# Patient Record
Sex: Male | Born: 1950 | Race: White | Hispanic: No | Smoking: Current every day smoker
Health system: Southern US, Community
[De-identification: ages and names within clinical notes are randomized; demographics above are authoritative.]

## PROBLEM LIST (undated history)

## (undated) DIAGNOSIS — K219 Gastro-esophageal reflux disease without esophagitis: Secondary | ICD-10-CM

## (undated) DIAGNOSIS — I639 Cerebral infarction, unspecified: Secondary | ICD-10-CM

## (undated) DIAGNOSIS — E119 Type 2 diabetes mellitus without complications: Secondary | ICD-10-CM

## (undated) DIAGNOSIS — Z8781 Personal history of (healed) traumatic fracture: Secondary | ICD-10-CM

## (undated) DIAGNOSIS — I10 Essential (primary) hypertension: Secondary | ICD-10-CM

## (undated) DIAGNOSIS — E785 Hyperlipidemia, unspecified: Secondary | ICD-10-CM

## (undated) DIAGNOSIS — J449 Chronic obstructive pulmonary disease, unspecified: Secondary | ICD-10-CM

## (undated) HISTORY — DX: Cerebral infarction, unspecified: I63.9

## (undated) HISTORY — PX: MULTIPLE TOOTH EXTRACTIONS: SHX2053

## (undated) HISTORY — DX: Chronic obstructive pulmonary disease, unspecified: J44.9

## (undated) HISTORY — PX: BACK SURGERY: SHX140

---

## 2010-09-24 HISTORY — PX: COLONOSCOPY: SHX174

## 2012-12-10 ENCOUNTER — Encounter: Payer: Self-pay | Admitting: Internal Medicine

## 2012-12-10 DIAGNOSIS — Z800 Family history of malignant neoplasm of digestive organs: Secondary | ICD-10-CM

## 2012-12-10 DIAGNOSIS — M479 Spondylosis, unspecified: Secondary | ICD-10-CM

## 2012-12-10 DIAGNOSIS — G8929 Other chronic pain: Secondary | ICD-10-CM

## 2012-12-22 DIAGNOSIS — M1990 Unspecified osteoarthritis, unspecified site: Secondary | ICD-10-CM

## 2012-12-22 DIAGNOSIS — Z800 Family history of malignant neoplasm of digestive organs: Secondary | ICD-10-CM

## 2012-12-22 DIAGNOSIS — M549 Dorsalgia, unspecified: Secondary | ICD-10-CM

## 2013-01-07 ENCOUNTER — Encounter: Payer: Self-pay | Admitting: Internal Medicine

## 2013-03-17 DIAGNOSIS — R5381 Other malaise: Secondary | ICD-10-CM

## 2013-03-17 DIAGNOSIS — M549 Dorsalgia, unspecified: Secondary | ICD-10-CM

## 2013-03-17 DIAGNOSIS — J4489 Other specified chronic obstructive pulmonary disease: Secondary | ICD-10-CM

## 2013-03-17 DIAGNOSIS — J449 Chronic obstructive pulmonary disease, unspecified: Secondary | ICD-10-CM

## 2013-03-17 DIAGNOSIS — R634 Abnormal weight loss: Secondary | ICD-10-CM

## 2013-03-17 DIAGNOSIS — R5383 Other fatigue: Secondary | ICD-10-CM

## 2015-05-03 ENCOUNTER — Other Ambulatory Visit: Payer: Self-pay | Admitting: Neurosurgery

## 2015-05-03 DIAGNOSIS — M48061 Spinal stenosis, lumbar region without neurogenic claudication: Secondary | ICD-10-CM

## 2015-05-10 ENCOUNTER — Ambulatory Visit
Admission: RE | Admit: 2015-05-10 | Discharge: 2015-05-10 | Disposition: A | Discharge status: Home / Self Care | Payer: Medicare FFS | Source: Ambulatory Visit | Attending: Neurosurgery | Admitting: Neurosurgery

## 2015-05-10 DIAGNOSIS — M48061 Spinal stenosis, lumbar region without neurogenic claudication: Secondary | ICD-10-CM

## 2015-05-10 MED ORDER — IOHEXOL 180 MG/ML  SOLN
15.0000 mL | Freq: Once | INTRAMUSCULAR | Status: DC | PRN
  Administered 2015-05-10: 15 mL via INTRATHECAL

## 2015-05-10 MED ORDER — DIAZEPAM 5 MG PO TABS
10.0000 mg | ORAL_TABLET | Freq: Once | ORAL | Status: AC
  Administered 2015-05-10: 5 mg via ORAL

## 2015-05-10 NOTE — Discharge Instructions (Signed)

## 2015-05-16 ENCOUNTER — Other Ambulatory Visit: Payer: Self-pay | Admitting: Neurosurgery

## 2015-05-23 ENCOUNTER — Encounter (HOSPITAL_COMMUNITY): Payer: Self-pay

## 2015-05-23 ENCOUNTER — Encounter (HOSPITAL_COMMUNITY)
Admission: RE | Admit: 2015-05-23 | Discharge: 2015-05-23 | Disposition: A | Discharge status: Home / Self Care | Payer: Medicare FFS | Source: Ambulatory Visit | Attending: Neurosurgery | Admitting: Neurosurgery

## 2015-05-23 DIAGNOSIS — E785 Hyperlipidemia, unspecified: Secondary | ICD-10-CM | POA: Diagnosis not present

## 2015-05-23 DIAGNOSIS — Z01812 Encounter for preprocedural laboratory examination: Secondary | ICD-10-CM | POA: Diagnosis not present

## 2015-05-23 DIAGNOSIS — Z0183 Encounter for blood typing: Secondary | ICD-10-CM | POA: Diagnosis not present

## 2015-05-23 DIAGNOSIS — E119 Type 2 diabetes mellitus without complications: Secondary | ICD-10-CM | POA: Diagnosis not present

## 2015-05-23 DIAGNOSIS — Z01818 Encounter for other preprocedural examination: Secondary | ICD-10-CM | POA: Insufficient documentation

## 2015-05-23 DIAGNOSIS — Z7982 Long term (current) use of aspirin: Secondary | ICD-10-CM | POA: Insufficient documentation

## 2015-05-23 DIAGNOSIS — Z79899 Other long term (current) drug therapy: Secondary | ICD-10-CM | POA: Diagnosis not present

## 2015-05-23 DIAGNOSIS — F17200 Nicotine dependence, unspecified, uncomplicated: Secondary | ICD-10-CM | POA: Diagnosis not present

## 2015-05-23 DIAGNOSIS — I10 Essential (primary) hypertension: Secondary | ICD-10-CM | POA: Diagnosis not present

## 2015-05-23 HISTORY — DX: Gastro-esophageal reflux disease without esophagitis: K21.9

## 2015-05-23 HISTORY — DX: Type 2 diabetes mellitus without complications: E11.9

## 2015-05-23 HISTORY — DX: Hyperlipidemia, unspecified: E78.5

## 2015-05-23 HISTORY — DX: Essential (primary) hypertension: I10

## 2015-05-23 HISTORY — DX: Personal history of (healed) traumatic fracture: Z87.81

## 2015-05-23 LAB — CBC
HEMATOCRIT: 33.5 % — AB (ref 39.0–52.0)
HEMOGLOBIN: 11.4 g/dL — AB (ref 13.0–17.0)
MCH: 32.1 pg (ref 26.0–34.0)
MCHC: 34 g/dL (ref 30.0–36.0)
MCV: 94.4 fL (ref 78.0–100.0)
Platelets: 271 10*3/uL (ref 150–400)
RBC: 3.55 MIL/uL — AB (ref 4.22–5.81)
RDW: 13.1 % (ref 11.5–15.5)
WBC: 7 10*3/uL (ref 4.0–10.5)

## 2015-05-23 LAB — BASIC METABOLIC PANEL
ANION GAP: 9 (ref 5–15)
BUN: 17 mg/dL (ref 6–20)
CHLORIDE: 107 mmol/L (ref 101–111)
CO2: 20 mmol/L — AB (ref 22–32)
Calcium: 9.7 mg/dL (ref 8.9–10.3)
Creatinine, Ser: 1.81 mg/dL — ABNORMAL HIGH (ref 0.61–1.24)
GFR calc non Af Amer: 38 mL/min — ABNORMAL LOW (ref >60)
GFR, EST AFRICAN AMERICAN: 44 mL/min — AB (ref >60)
Glucose, Bld: 90 mg/dL (ref 65–99)
POTASSIUM: 4.6 mmol/L (ref 3.5–5.1)
SODIUM: 136 mmol/L (ref 135–145)

## 2015-05-23 LAB — ABO/RH: ABO/RH(D): O POS

## 2015-05-23 LAB — TYPE AND SCREEN
ABO/RH(D): O POS
ANTIBODY SCREEN: NEGATIVE

## 2015-05-23 LAB — SURGICAL PCR SCREEN
MRSA, PCR: NEGATIVE
STAPHYLOCOCCUS AUREUS: NEGATIVE

## 2015-05-23 LAB — GLUCOSE, CAPILLARY
Glucose-Capillary: 100 mg/dL — ABNORMAL HIGH (ref 65–99)
Glucose-Capillary: 125 mg/dL — ABNORMAL HIGH (ref 65–99)

## 2015-05-23 NOTE — Pre-Procedure Instructions (Addendum)
Jon Velazquez  05/23/2015      [facility] [address], [place] - [name] [place] [address] Phone: [phone] Fax: [phone]    Your procedure is scheduled on Thursday September 1st 2016 at 730am.  Report to [facility] Admitting at 530 A.M.  Call this number if you have problems the morning of surgery:  [phone]   Call this number if you have problems in the days leading up to your surgery:  [phone]    Remember:  Do not eat food or drink liquids after midnight on Wednesday August 31st.  Take these medicines the morning of surgery with A SIP OF WATER simvastatin (zocor), gemfibrozil (lopid)  DO NOT take your diabetic medications the morning of surgery [glyburide (diabeta), metformin (glucophage), and sitagliptin (januvia)]   STOP: ALL Vitamins, Supplements, Effient and Herbal Medications, Fish Oils, Aspirins, NSAIDs (Nonsteroidal Anti-inflammatories such as Ibuprofen, Aleve, or Advil), and Goody's/BC Powders 7 days prior to surgery, until after surgery as directed by your physician. This includes: Calcium Carbonate, Vitamin D, Omega-3 Fatty Acids (Fish Oil).    Do not wear jewelry.  Do not wear lotions, powders, or perfumes.  You may wear deodorant.  Do not shave 48 hours prior to surgery.  Men may shave face and neck.  Do not bring valuables to the hospital.  [facility] is not responsible for any belongings or valuables.  Contacts, dentures or bridgework may not be worn into surgery.  Leave your suitcase in the car.  After surgery it may be brought to your room.  For patients admitted to the hospital, discharge time will be determined by your treatment team.  Patients discharged the day of surgery will not be allowed to drive home.   How to Manage Your Diabetes Before Surgery   Why is it important to control my blood sugar before and after surgery?   Improving blood sugar levels before and after surgery helps healing  and can limit problems.  A way of improving blood sugar control is eating a healthy diet by:  - Eating less sugar and carbohydrates  - Increasing activity/exercise  - Talk with your doctor about reaching your blood sugar goals  High blood sugars (greater than 180 mg/dL) can raise your risk of infections and slow down your recovery so you will need to focus on controlling your diabetes during the weeks before surgery.  Make sure that the doctor who takes care of your diabetes knows about your planned surgery including the date and location.  How do I manage my blood sugars before surgery?   Check your blood sugar at least 4 times a day, 2 days before surgery to make sure that they are not too high or low.   Check your blood sugar the morning of your surgery when you wake up and every 2               hours until you get to the Short-Stay unit.  If your blood sugar is less than 70 mg/dL, you will need to treat for low blood sugar by:  Treat a low blood sugar (less than 70 mg/dL) with 1/2 cup of clear juice (cranberry or apple), 4 glucose tablets, OR glucose gel.  Recheck blood sugar in 15 minutes after treatment (to make sure it is greater than 70 mg/dL).  If blood sugar is not greater than 70 mg/dL on re-check, call [phone] for further instructions.   Report  your blood sugar to the Short-Stay nurse when you get to Short-Stay.  References:  [facility], 2007 "How to Manage your Diabetes Before and After Surgery".  What do I do about my diabetes medications?   Do not take oral diabetes medicines (pills) the morning of surgery. THIS INCLUDES: GLYBURIDE (DIABETA), METFORMIN (GLUCOPHAGE), SITAGLIPTIN (JANUVIA).    Do not take other diabetes injectables the day of surgery including Byetta, Victoza, Bydureon, and Trulicity.    Special instructions: Please follow these instructions carefully:  1. Shower with CHG Soap the night before surgery  and the morning of Surgery. 2. If you choose to wash your hair, wash your hair first as usual with your normal shampoo. 3. After you shampoo, rinse your hair and body thoroughly to remove the Shampoo. 4. Use CHG as you would any other liquid soap. You can apply chg directly to the skin and wash gently with scrungie or a clean washcloth. 5. Apply the CHG Soap to your body ONLY FROM THE NECK DOWN. Do not use on open wounds or open sores. Avoid contact with your eyes, ears, mouth and genitals (private parts). Wash genitals (private parts) with your normal soap. 6. Wash thoroughly, paying special attention to the area where your surgery will be performed. 7. Thoroughly rinse your body with warm water from the neck down. 8. DO NOT shower/wash with your normal soap after using and rinsing off the CHG Soap. 9. Pat yourself dry with a clean towel.  10. Wear clean pajamas.  11. Place clean sheets on your bed the night of your first shower and do not sleep with pets.  Day of Surgery  Do not apply any lotions/deodorants the morning of surgery. Please wear clean clothes to the hospital/surgery center.    Please read over the following fact sheets that you were given. Pain Booklet, Coughing and Deep Breathing, Blood Transfusion Information, MRSA Information and Surgical Site Infection Prevention

## 2015-05-23 NOTE — Progress Notes (Signed)
Patient denies having seen a cardiologist or having Echo, Stress, EKG, or Cath done.  Patient's PCP is [facility] in [place], [place].

## 2015-05-23 NOTE — Progress Notes (Addendum)
Anesthesia Chart Review:  Pt is 64 year old male scheduled for L1-2, L2-3, L3-4, L4-5 PLIF on 05/26/2015 with Dr. Gerlene Fee.   PCP is Dr. Lia Hopping in [place].   PMH includes: HTN, DM, hyperlipidemia. Current smoker. BMI 27.   Medications include: ASA, benazepril, chlorthalidone, gemfibrozil, glyburide, metformin, simvastatin, sitagliptin.   Preoperative labs reviewed.  Cr 1.81, BUN 17. HgbA1c pending, glucose 125.   EKG 05/23/2015: NSR. Cannot rule out Anterior infarct, age undetermined  Have requested prior labs, office note and EKG from PCP's office for review. Will revisit chart when available.   Rica Mast, FNP-BC [facility]/Anesthesiology Phone: [phone] 05/23/2015 3:55 PM  Addendum: I received a voice message from Amy at [facility]. Apparently after receipt of PAT results, Dr. Olena Leatherwood asked that patient come in today for a pre-operative evaluation. He is recommending patient have a Lexiscan stress test prior to proceeding. There was also mention of possible other testing, but no specifics were given in the voice message. I notified Erie Noe at Dr. Trudee Grip office to contact Amy [phone]) to clarify timing of stress test and clearance status. With surgery scheduled for 05/26/15, I would anticipate a need to postpone.  Velna Ochs [facility] Stay Center/Anesthesiology Phone [phone] 05/24/2015 10:28 AM

## 2015-05-23 NOTE — Progress Notes (Signed)
   05/23/15 1237  OBSTRUCTIVE SLEEP APNEA  Have you ever been diagnosed with sleep apnea through a sleep study? No  Do you snore loudly (loud enough to be heard through closed doors)?  0  Do you often feel tired, fatigued, or sleepy during the daytime? 0  Has anyone observed you stop breathing during your sleep? 0  Do you have, or are you being treated for high blood pressure? 1  BMI more than 35 kg/m2? 0  Age over 65 years old? 1  Neck circumference greater than 40 cm/16 inches? 1 (18)  Gender: 1

## 2015-05-24 LAB — HEMOGLOBIN A1C
Hgb A1c MFr Bld: 7.1 % — ABNORMAL HIGH (ref 4.8–5.6)
Mean Plasma Glucose: 157 mg/dL

## 2015-06-13 ENCOUNTER — Encounter (HOSPITAL_COMMUNITY)
Admission: RE | Admit: 2015-06-13 | Discharge: 2015-06-13 | Disposition: A | Discharge status: Home / Self Care | Payer: Medicare FFS | Source: Ambulatory Visit | Attending: Neurosurgery | Admitting: Neurosurgery

## 2015-06-13 ENCOUNTER — Encounter (HOSPITAL_COMMUNITY): Payer: Self-pay

## 2015-06-13 LAB — BASIC METABOLIC PANEL
ANION GAP: 11 (ref 5–15)
BUN: 19 mg/dL (ref 6–20)
CALCIUM: 9.7 mg/dL (ref 8.9–10.3)
CO2: 19 mmol/L — ABNORMAL LOW (ref 22–32)
Chloride: 105 mmol/L (ref 101–111)
Creatinine, Ser: 1.48 mg/dL — ABNORMAL HIGH (ref 0.61–1.24)
GFR calc Af Amer: 56 mL/min — ABNORMAL LOW (ref >60)
GFR, EST NON AFRICAN AMERICAN: 48 mL/min — AB (ref >60)
GLUCOSE: 274 mg/dL — AB (ref 65–99)
Potassium: 4.8 mmol/L (ref 3.5–5.1)
SODIUM: 135 mmol/L (ref 135–145)

## 2015-06-13 LAB — CBC
HCT: 32.4 % — ABNORMAL LOW (ref 39.0–52.0)
Hemoglobin: 11.1 g/dL — ABNORMAL LOW (ref 13.0–17.0)
MCH: 32.3 pg (ref 26.0–34.0)
MCHC: 34.3 g/dL (ref 30.0–36.0)
MCV: 94.2 fL (ref 78.0–100.0)
Platelets: 278 10*3/uL (ref 150–400)
RBC: 3.44 MIL/uL — ABNORMAL LOW (ref 4.22–5.81)
RDW: 13.4 % (ref 11.5–15.5)
WBC: 9.9 10*3/uL (ref 4.0–10.5)

## 2015-06-13 LAB — GLUCOSE, CAPILLARY: Glucose-Capillary: 278 mg/dL — ABNORMAL HIGH (ref 65–99)

## 2015-06-13 NOTE — Progress Notes (Signed)
Patient presents for second PAT appointment after medical clearance and cardiology clearance completed.  CBC, BMET, and type and screen pending.    Medical and cardiology clearance in chart.    Patient denies shortness of breath and chest pain.   Patient states that he is very impatient and ready to get this surgery over with.

## 2015-06-13 NOTE — Pre-Procedure Instructions (Signed)
Jon Velazquez  06/13/2015      [facility] [address] [place] - [name] [place] [address] Phone: [phone] Fax: [phone]    Your procedure is scheduled on Thursday, September 22nd, 2016  Report to [facility] Admitting at 5:30 A.M.  Call this number if you have problems the morning of surgery:  [phone]   Remember:  Do not eat food or drink liquids after midnight.   Take these medicines the morning of surgery with A SIP OF WATER: Simvastatin (Zocor), Gemfibrozil (Lopid)  Do not take your diabetic medications the morning of surgery: Glyburide (diabeta), Metformin (Glucophage), and Sitagliptin (Januvia)  Stop taking the following: all vitamins, supplements, effient and herbal medications, fish oils, aspirins, NSAIDS, Ibuprofen, Aleve, Naproxen, Goody's, BC's, Fish Oil, Viatmin D, Calcium Carbonate.    Do not wear jewelry.  Do not wear lotions, powders, or colognes.  You may NOT wear deodorant.  Men may shave face and neck.  Do not bring valuables to the hospital.  [facility] is not responsible for any belongings or valuables.  Contacts, dentures or bridgework may not be worn into surgery.  Leave your suitcase in the car.  After surgery it may be brought to your room.  For patients admitted to the hospital, discharge time will be determined by your treatment team.  Patients discharged the day of surgery will not be allowed to drive home.   Special instructions:  See attached.   Please read over the following fact sheets that you were given. Pain Booklet, Coughing and Deep Breathing, Blood Transfusion Information, MRSA Information and Surgical Site Infection Prevention     How to Manage Your Diabetes Before Surgery   Why is it important to control my blood sugar before and after surgery?   Improving blood sugar levels before and after surgery helps healing and can limit problems.  A way of improving blood sugar control  is eating a healthy diet by:  - Eating less sugar and carbohydrates  - Increasing activity/exercise  - Talk with your doctor about reaching your blood sugar goals  High blood sugars (greater than 180 mg/dL) can raise your risk of infections and slow down your recovery so you will need to focus on controlling your diabetes during the weeks before surgery.  Make sure that the doctor who takes care of your diabetes knows about your planned surgery including the date and location.  How do I manage my blood sugars before surgery?   Check your blood sugar at least 4 times a day, 2 days before surgery to make sure that they are not too high or low.   Check your blood sugar the morning of your surgery when you wake up and every 2               hours until you get to the Short-Stay unit.  If your blood sugar is less than 70 mg/dL, you will need to treat for low blood sugar by:  Treat a low blood sugar (less than 70 mg/dL) with 1/2 cup of clear juice (cranberry or apple), 4 glucose tablets, OR glucose gel.  Recheck blood sugar in 15 minutes after treatment (to make sure it is greater than 70 mg/dL).  If blood sugar is not greater than 70 mg/dL on re-check, call [phone] for further instructions.   Report your blood sugar to the Short-Stay nurse when you get to Short-Stay.  References:  [facility], 2007 "How to Manage your Diabetes Before and After Surgery".

## 2015-06-13 NOTE — Progress Notes (Signed)
Anesthesia Chart Review:  Pt is 64 year old male scheduled for L1-2, L2-3, L3-4, L4-5 PLIF on 06/16/2015 with Dr. Gerlene Fee.   PCP is Dr. Lia Hopping in [place].   PMH includes: HTN, DM, hyperlipidemia, GERD. Current smoker. BMI 28.   Medications include: ASA, benazepril, chlorthalidone, gemfibrozil, glyburide, metformin, simvastatin, sitagliptin.   Preoperative labs reviewed. Cr 1.48, BUN 19. HgbA1c was 7.1 on 05/23/15. Glucose 274.   EKG 05/23/2015: NSR. Cannot rule out Anterior infarct, age undetermined  Nuclear stress test 05/31/2015:  -LVEF 45%. Normal wall motion -Negative nuclear stress test  Pt originally was scheduled for surgery 05/26/15, but it was delayed by PCP who wanted pt to have a stress test prior to surgery. Pt now has medical clearance from Dr. Olena Leatherwood on paper chart for surgery.   If no changes, I anticipate pt can proceed with surgery as scheduled.   Rica Mast, FNP-BC [facility] Stay [facility] Center/Anesthesiology Phone: [phone] 06/13/2015 3:11 PM

## 2015-06-16 ENCOUNTER — Inpatient Hospital Stay (HOSPITAL_COMMUNITY): Payer: Medicare FFS

## 2015-06-16 ENCOUNTER — Inpatient Hospital Stay (HOSPITAL_COMMUNITY): Payer: Medicare FFS | Admitting: Emergency Medicine

## 2015-06-16 ENCOUNTER — Encounter (HOSPITAL_COMMUNITY): Payer: Self-pay

## 2015-06-16 ENCOUNTER — Inpatient Hospital Stay (HOSPITAL_COMMUNITY)
Admission: RE | Admit: 2015-06-16 | Discharge: 2015-06-18 | DRG: 460 | Disposition: A | Discharge status: Home / Self Care | Payer: Medicare FFS | Source: Ambulatory Visit | Attending: Neurosurgery | Admitting: Neurosurgery

## 2015-06-16 ENCOUNTER — Inpatient Hospital Stay (HOSPITAL_COMMUNITY): Payer: Medicare FFS | Admitting: Anesthesiology

## 2015-06-16 ENCOUNTER — Encounter (HOSPITAL_COMMUNITY)
Admission: RE | Disposition: A | Discharge status: Home / Self Care | Payer: Medicare FFS | Source: Ambulatory Visit | Attending: Neurosurgery

## 2015-06-16 DIAGNOSIS — K219 Gastro-esophageal reflux disease without esophagitis: Secondary | ICD-10-CM | POA: Diagnosis present

## 2015-06-16 DIAGNOSIS — F17210 Nicotine dependence, cigarettes, uncomplicated: Secondary | ICD-10-CM | POA: Diagnosis present

## 2015-06-16 DIAGNOSIS — M4310 Spondylolisthesis, site unspecified: Secondary | ICD-10-CM | POA: Diagnosis present

## 2015-06-16 DIAGNOSIS — M4806 Spinal stenosis, lumbar region: Principal | ICD-10-CM | POA: Diagnosis present

## 2015-06-16 DIAGNOSIS — I10 Essential (primary) hypertension: Secondary | ICD-10-CM | POA: Diagnosis present

## 2015-06-16 DIAGNOSIS — Z79899 Other long term (current) drug therapy: Secondary | ICD-10-CM | POA: Diagnosis not present

## 2015-06-16 DIAGNOSIS — M549 Dorsalgia, unspecified: Secondary | ICD-10-CM | POA: Diagnosis present

## 2015-06-16 DIAGNOSIS — Z7982 Long term (current) use of aspirin: Secondary | ICD-10-CM | POA: Diagnosis not present

## 2015-06-16 DIAGNOSIS — M48062 Spinal stenosis, lumbar region with neurogenic claudication: Secondary | ICD-10-CM | POA: Diagnosis present

## 2015-06-16 DIAGNOSIS — E119 Type 2 diabetes mellitus without complications: Secondary | ICD-10-CM | POA: Diagnosis present

## 2015-06-16 DIAGNOSIS — M419 Scoliosis, unspecified: Secondary | ICD-10-CM | POA: Diagnosis present

## 2015-06-16 DIAGNOSIS — E785 Hyperlipidemia, unspecified: Secondary | ICD-10-CM | POA: Diagnosis present

## 2015-06-16 DIAGNOSIS — Z419 Encounter for procedure for purposes other than remedying health state, unspecified: Secondary | ICD-10-CM

## 2015-06-16 HISTORY — PX: POSTERIOR LUMBAR FUSION 4 LEVEL: SHX6037

## 2015-06-16 LAB — POCT I-STAT 7, (LYTES, BLD GAS, ICA,H+H)
ACID-BASE DEFICIT: 7 mmol/L — AB (ref 0.0–2.0)
ACID-BASE DEFICIT: 2 mmol/L (ref 0.0–2.0)
BICARBONATE: 23.1 meq/L (ref 20.0–24.0)
Bicarbonate: 19.7 mEq/L — ABNORMAL LOW (ref 20.0–24.0)
CALCIUM ION: 1.06 mmol/L — AB (ref 1.13–1.30)
Calcium, Ion: 1.15 mmol/L (ref 1.13–1.30)
HCT: 32 % — ABNORMAL LOW (ref 39.0–52.0)
HEMATOCRIT: 28 % — AB (ref 39.0–52.0)
HEMOGLOBIN: 10.9 g/dL — AB (ref 13.0–17.0)
Hemoglobin: 9.5 g/dL — ABNORMAL LOW (ref 13.0–17.0)
O2 Saturation: 100 %
O2 Saturation: 100 %
PCO2 ART: 44.3 mmHg (ref 35.0–45.0)
PH ART: 7.257 — AB (ref 7.350–7.450)
PH ART: 7.386 (ref 7.350–7.450)
PO2 ART: 282 mmHg — AB (ref 80.0–100.0)
POTASSIUM: 5.9 mmol/L — AB (ref 3.5–5.1)
Potassium: 5.9 mmol/L — ABNORMAL HIGH (ref 3.5–5.1)
SODIUM: 135 mmol/L (ref 135–145)
Sodium: 132 mmol/L — ABNORMAL LOW (ref 135–145)
TCO2: 21 mmol/L (ref 0–100)
TCO2: 24 mmol/L (ref 0–100)
pCO2 arterial: 38.5 mmHg (ref 35.0–45.0)
pO2, Arterial: 188 mmHg — ABNORMAL HIGH (ref 80.0–100.0)

## 2015-06-16 LAB — PREPARE RBC (CROSSMATCH)

## 2015-06-16 LAB — GLUCOSE, CAPILLARY
GLUCOSE-CAPILLARY: 156 mg/dL — AB (ref 65–99)
GLUCOSE-CAPILLARY: 247 mg/dL — AB (ref 65–99)
Glucose-Capillary: 189 mg/dL — ABNORMAL HIGH (ref 65–99)

## 2015-06-16 LAB — POCT I-STAT 4, (NA,K, GLUC, HGB,HCT)
GLUCOSE: 166 mg/dL — AB (ref 65–99)
GLUCOSE: 182 mg/dL — AB (ref 65–99)
Glucose, Bld: 218 mg/dL — ABNORMAL HIGH (ref 65–99)
HCT: 25 % — ABNORMAL LOW (ref 39.0–52.0)
HEMATOCRIT: 28 % — AB (ref 39.0–52.0)
HEMATOCRIT: 31 % — AB (ref 39.0–52.0)
HEMOGLOBIN: 9.5 g/dL — AB (ref 13.0–17.0)
HEMOGLOBIN: 10.5 g/dL — AB (ref 13.0–17.0)
Hemoglobin: 8.5 g/dL — ABNORMAL LOW (ref 13.0–17.0)
POTASSIUM: 5.5 mmol/L — AB (ref 3.5–5.1)
Potassium: 5.3 mmol/L — ABNORMAL HIGH (ref 3.5–5.1)
Potassium: 5.5 mmol/L — ABNORMAL HIGH (ref 3.5–5.1)
SODIUM: 133 mmol/L — AB (ref 135–145)
Sodium: 133 mmol/L — ABNORMAL LOW (ref 135–145)
Sodium: 136 mmol/L (ref 135–145)

## 2015-06-16 LAB — POCT I-STAT GLUCOSE
GLUCOSE: 220 mg/dL — AB (ref 65–99)
Operator id: 173792

## 2015-06-16 SURGERY — POSTERIOR LUMBAR FUSION 4 LEVEL
Anesthesia: General | Site: Back

## 2015-06-16 MED ORDER — NEOSTIGMINE METHYLSULFATE 10 MG/10ML IV SOLN
INTRAVENOUS | Status: DC | PRN
  Administered 2015-06-16: 5 mg via INTRAVENOUS

## 2015-06-16 MED ORDER — HYDROMORPHONE HCL 1 MG/ML IJ SOLN: 0.2500 mg | INTRAMUSCULAR | Status: DC | PRN

## 2015-06-16 MED ORDER — ZOLPIDEM TARTRATE 5 MG PO TABS: 5.0000 mg | ORAL_TABLET | Freq: Every evening | ORAL | Status: DC | PRN

## 2015-06-16 MED ORDER — BISACODYL 5 MG PO TBEC: 5.0000 mg | DELAYED_RELEASE_TABLET | Freq: Every day | ORAL | Status: DC | PRN

## 2015-06-16 MED ORDER — SODIUM BICARBONATE 4.2 % IV SOLN
INTRAVENOUS | Status: DC | PRN
  Administered 2015-06-16: 100 meq via INTRAVENOUS

## 2015-06-16 MED ORDER — SUFENTANIL CITRATE 50 MCG/ML IV SOLN
INTRAVENOUS | Status: AC
  Filled 2015-06-16: qty 1

## 2015-06-16 MED ORDER — FENTANYL CITRATE (PF) 250 MCG/5ML IJ SOLN
INTRAMUSCULAR | Status: AC
  Filled 2015-06-16: qty 5

## 2015-06-16 MED ORDER — DOCUSATE SODIUM 100 MG PO CAPS
100.0000 mg | ORAL_CAPSULE | Freq: Two times a day (BID) | ORAL | Status: DC
  Administered 2015-06-16 – 2015-06-18 (×3): 100 mg via ORAL
  Filled 2015-06-16 (×4): qty 1

## 2015-06-16 MED ORDER — ACETAMINOPHEN 325 MG PO TABS: 650.0000 mg | ORAL_TABLET | ORAL | Status: DC | PRN

## 2015-06-16 MED ORDER — HYDROMORPHONE HCL 1 MG/ML IJ SOLN
1.0000 mg | Freq: Once | INTRAMUSCULAR | Status: AC
  Administered 2015-06-16: 1 mg via INTRAVENOUS
  Filled 2015-06-16: qty 1

## 2015-06-16 MED ORDER — HYDROMORPHONE HCL 1 MG/ML IJ SOLN
1.0000 mg | INTRAMUSCULAR | Status: DC | PRN
Start: 2015-06-16 — End: 2015-06-18
  Administered 2015-06-17 (×2): 1 mg via INTRAMUSCULAR
  Filled 2015-06-16 (×3): qty 1

## 2015-06-16 MED ORDER — SODIUM CHLORIDE 0.9 % IJ SOLN
3.0000 mL | Freq: Two times a day (BID) | INTRAMUSCULAR | Status: DC
  Administered 2015-06-16: 3 mL via INTRAVENOUS

## 2015-06-16 MED ORDER — THROMBIN 20000 UNITS EX SOLR
CUTANEOUS | Status: DC | PRN
  Administered 2015-06-16 (×4): via TOPICAL

## 2015-06-16 MED ORDER — SUFENTANIL CITRATE 50 MCG/ML IV SOLN
INTRAVENOUS | Status: DC | PRN
  Administered 2015-06-16 (×4): 5 ug via INTRAVENOUS
  Administered 2015-06-16: 10 ug via INTRAVENOUS
  Administered 2015-06-16: 20 ug via INTRAVENOUS
  Administered 2015-06-16 (×5): 5 ug via INTRAVENOUS
  Administered 2015-06-16 (×2): 10 ug via INTRAVENOUS
  Administered 2015-06-16: 5 ug via INTRAVENOUS

## 2015-06-16 MED ORDER — SODIUM CHLORIDE 0.9 % IJ SOLN: 3.0000 mL | INTRAMUSCULAR | Status: DC | PRN

## 2015-06-16 MED ORDER — CEFAZOLIN SODIUM-DEXTROSE 2-3 GM-% IV SOLR
2.0000 g | Freq: Three times a day (TID) | INTRAVENOUS | Status: AC
  Administered 2015-06-16 – 2015-06-17 (×2): 2 g via INTRAVENOUS
  Filled 2015-06-16 (×2): qty 50

## 2015-06-16 MED ORDER — METFORMIN HCL 500 MG PO TABS
1000.0000 mg | ORAL_TABLET | Freq: Two times a day (BID) | ORAL | Status: DC
  Administered 2015-06-17: 1000 mg via ORAL
  Filled 2015-06-16: qty 2

## 2015-06-16 MED ORDER — ONDANSETRON HCL 4 MG/2ML IJ SOLN
4.0000 mg | INTRAMUSCULAR | Status: DC | PRN
  Administered 2015-06-16 – 2015-06-17 (×2): 4 mg via INTRAVENOUS
  Filled 2015-06-16 (×2): qty 2

## 2015-06-16 MED ORDER — GLYCOPYRROLATE 0.2 MG/ML IJ SOLN
INTRAMUSCULAR | Status: DC | PRN
  Administered 2015-06-16: 0.6 mg via INTRAVENOUS

## 2015-06-16 MED ORDER — PROPOFOL 10 MG/ML IV BOLUS
INTRAVENOUS | Status: DC | PRN
  Administered 2015-06-16: 150 mg via INTRAVENOUS

## 2015-06-16 MED ORDER — INSULIN ASPART 100 UNIT/ML ~~LOC~~ SOLN: 0.0000 [IU] | Freq: Three times a day (TID) | SUBCUTANEOUS | Status: DC

## 2015-06-16 MED ORDER — POTASSIUM CHLORIDE IN NACL 20-0.45 MEQ/L-% IV SOLN
INTRAVENOUS | Status: DC
  Administered 2015-06-16 – 2015-06-17 (×2): via INTRAVENOUS
  Filled 2015-06-16 (×4): qty 1000

## 2015-06-16 MED ORDER — MAGNESIUM CITRATE PO SOLN
1.0000 | Freq: Once | ORAL | Status: DC | PRN
  Filled 2015-06-16: qty 296

## 2015-06-16 MED ORDER — DEXTROSE 5 % IV SOLN
10.0000 mg | INTRAVENOUS | Status: DC | PRN
  Administered 2015-06-16: 30 ug/min via INTRAVENOUS

## 2015-06-16 MED ORDER — LIDOCAINE HCL (CARDIAC) 20 MG/ML IV SOLN
INTRAVENOUS | Status: DC | PRN
  Administered 2015-06-16: 60 mg via INTRAVENOUS

## 2015-06-16 MED ORDER — BUPIVACAINE HCL (PF) 0.5 % IJ SOLN
INTRAMUSCULAR | Status: DC | PRN
  Administered 2015-06-16: 30 mL

## 2015-06-16 MED ORDER — SODIUM CHLORIDE 0.9 % IR SOLN
Status: DC | PRN
  Administered 2015-06-16 (×2)

## 2015-06-16 MED ORDER — PANTOPRAZOLE SODIUM 40 MG IV SOLR
40.0000 mg | Freq: Every day | INTRAVENOUS | Status: DC
  Administered 2015-06-16 – 2015-06-17 (×2): 40 mg via INTRAVENOUS
  Filled 2015-06-16 (×2): qty 40

## 2015-06-16 MED ORDER — SODIUM CHLORIDE 0.9 % IV SOLN
INTRAVENOUS | Status: DC | PRN
  Administered 2015-06-16: 12:00:00 via INTRAVENOUS

## 2015-06-16 MED ORDER — DEXAMETHASONE SODIUM PHOSPHATE 4 MG/ML IJ SOLN: 4.0000 mg | Freq: Four times a day (QID) | INTRAMUSCULAR | Status: AC

## 2015-06-16 MED ORDER — LACTATED RINGERS IV SOLN
INTRAVENOUS | Status: DC | PRN
  Administered 2015-06-16 (×2): via INTRAVENOUS

## 2015-06-16 MED ORDER — MIDAZOLAM HCL 2 MG/2ML IJ SOLN
INTRAMUSCULAR | Status: AC
  Filled 2015-06-16: qty 2

## 2015-06-16 MED ORDER — INSULIN ASPART 100 UNIT/ML ~~LOC~~ SOLN
4.0000 [IU] | Freq: Three times a day (TID) | SUBCUTANEOUS | Status: DC
Start: 2015-06-17 — End: 2015-06-18

## 2015-06-16 MED ORDER — ONDANSETRON HCL 4 MG/2ML IJ SOLN
INTRAMUSCULAR | Status: DC | PRN
  Administered 2015-06-16: 4 mg via INTRAVENOUS

## 2015-06-16 MED ORDER — DEXAMETHASONE 4 MG PO TABS
4.0000 mg | ORAL_TABLET | Freq: Four times a day (QID) | ORAL | Status: AC
  Administered 2015-06-16: 4 mg via ORAL
  Filled 2015-06-16 (×2): qty 1

## 2015-06-16 MED ORDER — LACTATED RINGERS IV SOLN
INTRAVENOUS | Status: DC | PRN
  Administered 2015-06-16: 08:00:00 via INTRAVENOUS

## 2015-06-16 MED ORDER — ALUM & MAG HYDROXIDE-SIMETH 200-200-20 MG/5ML PO SUSP
30.0000 mL | Freq: Four times a day (QID) | ORAL | Status: DC | PRN
Start: 2015-06-16 — End: 2015-06-18
  Administered 2015-06-17: 30 mL via ORAL
  Filled 2015-06-16: qty 30

## 2015-06-16 MED ORDER — ALBUMIN HUMAN 5 % IV SOLN
INTRAVENOUS | Status: DC | PRN
  Administered 2015-06-16 (×2): via INTRAVENOUS

## 2015-06-16 MED ORDER — BENAZEPRIL HCL 20 MG PO TABS
40.0000 mg | ORAL_TABLET | Freq: Every day | ORAL | Status: DC
  Administered 2015-06-18: 40 mg via ORAL
  Filled 2015-06-16 (×2): qty 2

## 2015-06-16 MED ORDER — FUROSEMIDE 10 MG/ML IJ SOLN
INTRAMUSCULAR | Status: DC | PRN
  Administered 2015-06-16 (×2): 10 mg via INTRAMUSCULAR

## 2015-06-16 MED ORDER — DEXAMETHASONE SODIUM PHOSPHATE 10 MG/ML IJ SOLN
10.0000 mg | INTRAMUSCULAR | Status: AC
  Administered 2015-06-16: 10 mg via INTRAVENOUS
  Filled 2015-06-16: qty 1

## 2015-06-16 MED ORDER — LINAGLIPTIN 5 MG PO TABS
5.0000 mg | ORAL_TABLET | Freq: Every day | ORAL | Status: DC
  Administered 2015-06-17 – 2015-06-18 (×2): 5 mg via ORAL
  Filled 2015-06-16 (×3): qty 1

## 2015-06-16 MED ORDER — PHENOL 1.4 % MT LIQD: 1.0000 | OROMUCOSAL | Status: DC | PRN

## 2015-06-16 MED ORDER — ROCURONIUM BROMIDE 100 MG/10ML IV SOLN
INTRAVENOUS | Status: DC | PRN
  Administered 2015-06-16 (×6): 10 mg via INTRAVENOUS
  Administered 2015-06-16: 60 mg via INTRAVENOUS
  Administered 2015-06-16 (×3): 20 mg via INTRAVENOUS
  Administered 2015-06-16 (×3): 10 mg via INTRAVENOUS

## 2015-06-16 MED ORDER — SODIUM CHLORIDE 0.9 % IV SOLN
10.0000 mL/h | Freq: Once | INTRAVENOUS | Status: AC
  Administered 2015-06-16: 12:00:00 via INTRAVENOUS

## 2015-06-16 MED ORDER — FENTANYL 50 MCG/ML (10ML) SYRINGE FOR MEDFUSION PUMP - OPTIME
INTRAMUSCULAR | Status: DC | PRN
  Administered 2015-06-16 (×2): 50 ug via INTRAVENOUS

## 2015-06-16 MED ORDER — ARTIFICIAL TEARS OP OINT
TOPICAL_OINTMENT | OPHTHALMIC | Status: DC | PRN
  Administered 2015-06-16: 1 via OPHTHALMIC

## 2015-06-16 MED ORDER — GLYBURIDE 5 MG PO TABS
5.0000 mg | ORAL_TABLET | Freq: Two times a day (BID) | ORAL | Status: DC
  Administered 2015-06-16 – 2015-06-17 (×3): 5 mg via ORAL
  Filled 2015-06-16 (×5): qty 1

## 2015-06-16 MED ORDER — VANCOMYCIN HCL 1000 MG IV SOLR
INTRAVENOUS | Status: AC
  Filled 2015-06-16: qty 1000

## 2015-06-16 MED ORDER — MIDAZOLAM HCL 5 MG/5ML IJ SOLN
INTRAMUSCULAR | Status: DC | PRN
  Administered 2015-06-16: 1 mg via INTRAVENOUS

## 2015-06-16 MED ORDER — BACITRACIN ZINC 500 UNIT/GM EX OINT
TOPICAL_OINTMENT | CUTANEOUS | Status: DC | PRN
  Administered 2015-06-16: 1 via TOPICAL

## 2015-06-16 MED ORDER — CYCLOBENZAPRINE HCL 10 MG PO TABS
10.0000 mg | ORAL_TABLET | Freq: Three times a day (TID) | ORAL | Status: DC | PRN
  Filled 2015-06-16: qty 1

## 2015-06-16 MED ORDER — GEMFIBROZIL 600 MG PO TABS
600.0000 mg | ORAL_TABLET | Freq: Two times a day (BID) | ORAL | Status: DC
  Administered 2015-06-16 – 2015-06-17 (×2): 600 mg via ORAL
  Filled 2015-06-16 (×5): qty 1

## 2015-06-16 MED ORDER — INSULIN ASPART 100 UNIT/ML ~~LOC~~ SOLN
0.0000 [IU] | Freq: Every day | SUBCUTANEOUS | Status: DC
  Administered 2015-06-16: 2 [IU] via SUBCUTANEOUS

## 2015-06-16 MED ORDER — VANCOMYCIN HCL 1000 MG IV SOLR
INTRAVENOUS | Status: DC | PRN
Start: 2015-06-16 — End: 2015-06-16
  Administered 2015-06-16 (×2): 1000 mg

## 2015-06-16 MED ORDER — SODIUM CHLORIDE 0.9 % IV SOLN: 250.0000 mL | INTRAVENOUS | Status: DC

## 2015-06-16 MED ORDER — 0.9 % SODIUM CHLORIDE (POUR BTL) OPTIME
TOPICAL | Status: DC | PRN
  Administered 2015-06-16 (×2): 1000 mL

## 2015-06-16 MED ORDER — CHLORTHALIDONE 25 MG PO TABS
25.0000 mg | ORAL_TABLET | Freq: Every day | ORAL | Status: DC
  Administered 2015-06-18: 25 mg via ORAL
  Filled 2015-06-16 (×4): qty 1

## 2015-06-16 MED ORDER — ACETAMINOPHEN 650 MG RE SUPP: 650.0000 mg | RECTAL | Status: DC | PRN

## 2015-06-16 MED ORDER — OXYCODONE-ACETAMINOPHEN 5-325 MG PO TABS
1.0000 | ORAL_TABLET | ORAL | Status: DC | PRN
  Administered 2015-06-16: 1 via ORAL
  Administered 2015-06-17 (×2): 2 via ORAL
  Filled 2015-06-16 (×4): qty 2

## 2015-06-16 MED ORDER — CEFAZOLIN SODIUM-DEXTROSE 2-3 GM-% IV SOLR
2.0000 g | INTRAVENOUS | Status: AC
  Administered 2015-06-16 (×2): 2 g via INTRAVENOUS
  Filled 2015-06-16: qty 50

## 2015-06-16 MED ORDER — MENTHOL 3 MG MT LOZG: 1.0000 | LOZENGE | OROMUCOSAL | Status: DC | PRN

## 2015-06-16 MED ORDER — PHENYLEPHRINE HCL 10 MG/ML IJ SOLN
INTRAMUSCULAR | Status: DC | PRN
  Administered 2015-06-16: 80 ug via INTRAVENOUS

## 2015-06-16 SURGICAL SUPPLY — 67 items
BAG DECANTER FOR FLEXI CONT (MISCELLANEOUS) ×2 IMPLANT
BENZOIN TINCTURE PRP APPL 2/3 (GAUZE/BANDAGES/DRESSINGS) ×4 IMPLANT
BLADE CLIPPER SURG (BLADE) ×2 IMPLANT
BONE EQUIVA 10CC (Bone Implant) ×6 IMPLANT
BONE EQUIVA 5CC (Bone Implant) ×2 IMPLANT
BRUSH SCRUB EZ PLAIN DRY (MISCELLANEOUS) ×2 IMPLANT
BUR CUTTER 7.0 ROUND (BURR) ×8 IMPLANT
BUR MATCHSTICK NEURO 3.0 LAGG (BURR) ×2 IMPLANT
CANISTER SUCT 3000ML PPV (MISCELLANEOUS) ×2 IMPLANT
CONT SPEC 4OZ CLIKSEAL STRL BL (MISCELLANEOUS) ×2 IMPLANT
COVER BACK TABLE 60X90IN (DRAPES) ×2 IMPLANT
DRAPE C-ARM 42X72 X-RAY (DRAPES) ×4 IMPLANT
DRAPE LAPAROTOMY 100X72X124 (DRAPES) ×2 IMPLANT
DRAPE SURG 17X23 STRL (DRAPES) ×4 IMPLANT
DRSG OPSITE POSTOP 4X10 (GAUZE/BANDAGES/DRESSINGS) ×2 IMPLANT
DRSG OPSITE POSTOP 4X6 (GAUZE/BANDAGES/DRESSINGS) ×2 IMPLANT
DRSG OPSITE POSTOP 4X8 (GAUZE/BANDAGES/DRESSINGS) IMPLANT
DURAPREP 26ML APPLICATOR (WOUND CARE) ×2 IMPLANT
ELECT REM PT RETURN 9FT ADLT (ELECTROSURGICAL) ×2
ELECTRODE REM PT RTRN 9FT ADLT (ELECTROSURGICAL) ×1 IMPLANT
EVACUATOR 1/8 PVC DRAIN (DRAIN) ×2 IMPLANT
GAUZE SPONGE 4X4 12PLY STRL (GAUZE/BANDAGES/DRESSINGS) IMPLANT
GAUZE SPONGE 4X4 16PLY XRAY LF (GAUZE/BANDAGES/DRESSINGS) ×4 IMPLANT
GLOVE BIOGEL PI IND STRL 7.5 (GLOVE) ×1 IMPLANT
GLOVE BIOGEL PI INDICATOR 7.5 (GLOVE) ×1
GLOVE ECLIPSE 7.5 STRL STRAW (GLOVE) ×4 IMPLANT
GLOVE ECLIPSE 8.0 STRL XLNG CF (GLOVE) ×8 IMPLANT
GLOVE INDICATOR 7.5 STRL GRN (GLOVE) ×8 IMPLANT
GLOVE INDICATOR 8.0 STRL GRN (GLOVE) ×2 IMPLANT
GLOVE SURG SS PI 7.0 STRL IVOR (GLOVE) ×10 IMPLANT
GOWN STRL REUS W/ TWL LRG LVL3 (GOWN DISPOSABLE) ×2 IMPLANT
GOWN STRL REUS W/ TWL XL LVL3 (GOWN DISPOSABLE) ×3 IMPLANT
GOWN STRL REUS W/TWL 2XL LVL3 (GOWN DISPOSABLE) ×2 IMPLANT
GOWN STRL REUS W/TWL LRG LVL3 (GOWN DISPOSABLE) ×2
GOWN STRL REUS W/TWL XL LVL3 (GOWN DISPOSABLE) ×3
IMPLANT ARDIS PEEK 10X9X26 (Orthopedic Implant) ×8 IMPLANT
IMPLANT PEEK ARDIS 8 X 8 X 26 (Orthopedic Implant) ×8 IMPLANT
KIT BASIN OR (CUSTOM PROCEDURE TRAY) ×2 IMPLANT
KIT ROOM TURNOVER OR (KITS) ×2 IMPLANT
LIQUID BAND (GAUZE/BANDAGES/DRESSINGS) IMPLANT
NEEDLE HYPO 22GX1.5 SAFETY (NEEDLE) ×2 IMPLANT
NS IRRIG 1000ML POUR BTL (IV SOLUTION) ×4 IMPLANT
PACK LAMINECTOMY NEURO (CUSTOM PROCEDURE TRAY) ×2 IMPLANT
PAD ARMBOARD 7.5X6 YLW CONV (MISCELLANEOUS) ×6 IMPLANT
PATTIES SURGICAL .75X.75 (GAUZE/BANDAGES/DRESSINGS) ×2 IMPLANT
PATTIES SURGICAL 1X1 (DISPOSABLE) ×2 IMPLANT
PEDIGUARD CURV (INSTRUMENTS) ×2 IMPLANT
ROD PERC 140MM (Rod) ×4 IMPLANT
SCREW MIN INVASIVE 6.5X45 (Screw) ×8 IMPLANT
SCREW POLYAXIA MIS 6.5X40MM (Screw) ×10 IMPLANT
SPEEDLINK 2 MED (Screw) ×2 IMPLANT
SPONGE LAP 4X18 X RAY DECT (DISPOSABLE) ×2 IMPLANT
SPONGE SURGIFOAM ABS GEL 100 (HEMOSTASIS) ×8 IMPLANT
STIMULATOR SPF PLUS 60/M (Stimulator) ×2 IMPLANT
STRIP CLOSURE SKIN 1/2X4 (GAUZE/BANDAGES/DRESSINGS) ×2 IMPLANT
SUT PROLENE 0 CT 1 30 (SUTURE) IMPLANT
SUT VIC AB 0 CT1 18XCR BRD8 (SUTURE) ×2 IMPLANT
SUT VIC AB 0 CT1 8-18 (SUTURE) ×2
SUT VIC AB 2-0 OS6 18 (SUTURE) ×10 IMPLANT
SUT VIC AB 3-0 CP2 18 (SUTURE) ×4 IMPLANT
TOP CLSR SEQUOIA (Orthopedic Implant) ×18 IMPLANT
TOWEL OR 17X24 6PK STRL BLUE (TOWEL DISPOSABLE) ×2 IMPLANT
TOWEL OR 17X26 10 PK STRL BLUE (TOWEL DISPOSABLE) ×4 IMPLANT
TRAP SPECIMEN MUCOUS 40CC (MISCELLANEOUS) ×2 IMPLANT
TRAY FOLEY CATH 16FRSI W/METER (SET/KITS/TRAYS/PACK) ×2 IMPLANT
TRAY FOLEY W/METER SILVER 14FR (SET/KITS/TRAYS/PACK) IMPLANT
WATER STERILE IRR 1000ML POUR (IV SOLUTION) ×2 IMPLANT

## 2015-06-16 NOTE — Plan of Care (Signed)
Problem: Consults Goal: Diagnosis - Spinal Surgery L1-L5 laminectomy with fusion

## 2015-06-16 NOTE — Anesthesia Postprocedure Evaluation (Signed)
  Anesthesia Post-op Note  Patient: Jon Velazquez  Procedure(s) Performed: Procedure(s): POSTERIOR LUMBAR INTERBODY FUSION WITH PEDICLE SCREWS LUMBAR ONE-TWO ,LUMBAR TWO-THREE,LUMBAR THREE-FOUR  ,LUMBAR FOUR-FIVE ;INSERTION BONE GROWTH STIMULATOR  (N/A)  Patient Location: PACU  Anesthesia Type:General  Level of Consciousness: awake, alert  and oriented  Airway and Oxygen Therapy: Patient Spontanous Breathing  Post-op Pain: mild  Post-op Assessment: Post-op Vital signs reviewed and Patient's Cardiovascular Status Stable LLE Motor Response: Purposeful movement LLE Sensation: Full sensation RLE Motor Response: Purposeful movement RLE Sensation: Full sensation      Post-op Vital Signs: Reviewed and stable  Last Vitals:  Filed Vitals:   06/16/15 1713  BP: 154/69  Pulse:   Temp:   Resp:     Complications: No apparent anesthesia complications

## 2015-06-16 NOTE — H&P (Signed)
Jon Velazquez is an 64 y.o. male.   Chief Complaint: Back pain into the legs HPI: The patient is a 64 year old gentleman who is evaluated in the office for back and buttock pain which goes into the legs and is equal. He's had this problem for a year and half the when she had back surgery many years ago. As release general 2015 he was walking 6 miles daily and over 3-4 months Time got to the point where he could barely walk 100 yards. She's tried some medications without improvement. An MRI scan was done he was seen in the office. He was tried an additional conservative therapy with epidural shots and this gave him no improvement. He underwent myelography which showed significant disease from L1 to all the way down to L4-5 with listhesis scoliosis and stenosis. After discussing the options the patient requested surgery now comes for a 4 level decompression with interbody fusion and pedicle screw fixation. I've had a long discussion with him regarding the risks and benefits of surgical intervention. The risks discussed include but are not limited to bleeding infection weakness some as paralysis spinal fluid leak nonunion trouble with hardware coma and death. We have discussed alternative methods of therapy along with the risks and benefits of nonintervention. Mr. Goynes is had the opportunity to ask numerous questions and appears to understand. With this information in hand he has requested we proceed with surgery.  Past Medical History  Diagnosis Date  . Hyperlipidemia   . Hypertension   . Diabetes mellitus without complication     type 2  . GERD (gastroesophageal reflux disease)   . H/O fracture of ankle     Past Surgical History  Procedure Laterality Date  . Back surgery  mid 1980s    L5-S1, Dr. Jeannetta Nap, [place]  . Multiple tooth extractions    . Colonoscopy      History reviewed. No pertinent family history. Social History:  reports that he has been smoking Cigarettes.  He has a 75  pack-year smoking history. He has never used smokeless tobacco. He reports that he does not drink alcohol. His drug history is not on file.  Allergies: No Known Allergies  Medications Prior to Admission  Medication Sig Dispense Refill  . aspirin EC 325 MG tablet Take 325 mg by mouth daily.     . benazepril (LOTENSIN) 40 MG tablet Take 40 mg by mouth daily.     . calcium carbonate (OS-CAL) 600 MG TABS tablet Take 600 mg by mouth daily.     . chlorthalidone (HYGROTON) 25 MG tablet Take 25 mg by mouth daily.     . Cholecalciferol (VITAMIN D-3) 1000 UNITS CAPS Take 1,000 Units by mouth daily.     Marland Kitchen gemfibrozil (LOPID) 600 MG tablet Take 600 mg by mouth 2 (two) times daily.     Marland Kitchen glyBURIDE (DIABETA) 5 MG tablet Take 5 mg by mouth 2 (two) times daily.     . metFORMIN (GLUCOPHAGE) 1000 MG tablet Take 1,000 mg by mouth 2 (two) times daily.     . Omega-3 Fatty Acids (FISH OIL) 1000 MG CAPS Take 1,000 mg by mouth daily.     . simvastatin (ZOCOR) 10 MG tablet Take 10 mg by mouth daily.     . sitaGLIPtin (JANUVIA) 100 MG tablet Take 100 mg by mouth daily.       Results for orders placed or performed during the hospital encounter of 06/16/15 (from the past 48 hour(s))  Glucose, capillary  Status: Abnormal   Collection Time: 06/16/15  6:26 AM  Result Value Ref Range   Glucose-Capillary 156 (H) 65 - 99 mg/dL   No results found.  Unremarkable except for the issues mentioned history of present illness  Blood pressure 151/78, pulse 80, temperature 96.7 F (35.9 C), temperature source Oral, resp. rate 20, weight 95.255 kg (210 lb), SpO2 100 %.  The patient is awake alert and oriented. He has no facial asymmetry. He has weakness of the extensor hallucis longus on the right. Reflexes are absent but equal. Assessment/Plan Impression is that of multilevel stenosis with scoliosis and listhesis. The plan is for a 4 level decompression with fusion and screw fixation  Reinaldo Meeker, MD 06/16/2015, 7:37  AM

## 2015-06-16 NOTE — Anesthesia Procedure Notes (Signed)
Procedure Name: Intubation Date/Time: 06/16/2015 7:45 AM Performed by: Coralee Rud Pre-anesthesia Checklist: Patient identified, Emergency Drugs available, Suction available and Patient being monitored Patient Re-evaluated:Patient Re-evaluated prior to inductionOxygen Delivery Method: Circle system utilized Preoxygenation: Pre-oxygenation with 100% oxygen Intubation Type: IV induction Ventilation: Mask ventilation without difficulty Laryngoscope Size: Miller and 3 Grade View: Grade I Tube type: Oral Tube size: 7.5 mm Number of attempts: 1 Airway Equipment and Method: Stylet Placement Confirmation: ETT inserted through vocal cords under direct vision,  positive ETCO2 and breath sounds checked- equal and bilateral Secured at: 22 cm Tube secured with: Tape Dental Injury: Teeth and Oropharynx as per pre-operative assessment

## 2015-06-16 NOTE — Transfer of Care (Signed)
Immediate Anesthesia Transfer of Care Note  Patient: [name]  Procedure(s) Performed: Procedure(s): POSTERIOR LUMBAR INTERBODY FUSION WITH PEDICLE SCREWS LUMBAR ONE-TWO ,LUMBAR TWO-THREE,LUMBAR THREE-FOUR  ,LUMBAR FOUR-FIVE ;INSERTION BONE GROWTH STIMULATOR  (N/A)  Patient Location: PACU  Anesthesia Type:General  Level of Consciousness: awake, sedated and patient cooperative  Airway & Oxygen Therapy: Patient Spontanous Breathing and Patient connected to face mask oxygen  Post-op Assessment: Report given to RN, Post -op Vital signs reviewed and stable and Patient moving all extremities  Post vital signs: Reviewed and stable  Last Vitals:  Filed Vitals:   06/16/15 0621  BP: 151/78  Pulse: 80  Temp: 35.9 C  Resp: 20    Complications: No apparent anesthesia complications

## 2015-06-16 NOTE — Anesthesia Preprocedure Evaluation (Signed)
Anesthesia Evaluation  Patient identified by MRN, date of birth, ID band Patient awake    Reviewed: Allergy & Precautions, NPO status   Airway Mallampati: I  TM Distance: >3 FB Neck ROM: Full    Dental   Pulmonary Current Smoker,    breath sounds clear to auscultation       Cardiovascular hypertension,  Rhythm:Regular Rate:Normal     Neuro/Psych    GI/Hepatic Neg liver ROS, GERD  ,  Endo/Other  diabetes  Renal/GU negative Renal ROS     Musculoskeletal   Abdominal   Peds  Hematology   Anesthesia Other Findings   Reproductive/Obstetrics                             Anesthesia Physical Anesthesia Plan  ASA: III  Anesthesia Plan: General   Post-op Pain Management:    Induction: Intravenous  Airway Management Planned: Oral ETT  Additional Equipment:   Intra-op Plan:   Post-operative Plan: Possible Post-op intubation/ventilation  Informed Consent: I have reviewed the patients History and Physical, chart, labs and discussed the procedure including the risks, benefits and alternatives for the proposed anesthesia with the patient or authorized representative who has indicated his/her understanding and acceptance.   Dental advisory given  Plan Discussed with: CRNA and Anesthesiologist  Anesthesia Plan Comments:         Anesthesia Quick Evaluation

## 2015-06-16 NOTE — Op Note (Signed)
Preop diagnosis: Spondylolisthesis, stenosis, scoliosis, L1-L2 L2 3 L3-4 and L4-5 Postop diagnosis: Same Procedure: Bilateral L1-L2 3 L3-4 L4-5 decompressive laminectomy for relief of midline and lateral recess stenosis Herniated disc L1-2, L3-4 Bilateral L1-L2-3L3-4L 4-5 microdiscectomy L1-2, L2-3, L3-4, L4-5, posterior lumbar interbody fusion with peek interbody spacer L1-L5 segmental pedicle screw instrumentation with Pathfinder pedicle screw system L1-L5 posterior lateral fusion Implantation of internal bone growth stimulator Surgeon: Leray Garverick Asst.: Jones  After being placed the prone position the patient's back was prepped and draped in the usual sterile fashion. Localizing x-ray was taken to identify the appropriate levels. Midline incision was made above the spinous processes of L1-L2 L3-L4 and L5. The incision was carried on the spinous processes. Subperiosteal dissection was then carried out bilaterally on the spinous processes lamina facet joint and the far lateral region to identify the transverse processes of L1-L2 L3-L4 and L5. Self-retaining tract was placed for exposure and x-ray showed approach the appropriate levels. Spinous processes were removed. Starting the patient's left side generous laminotomies were performed at L1-2, L2-3, L3-4, and L4-5. Bony removal was carried out as well as removal of hypertrophic ligamentum flavum until the hemi-canal had been well decompressed. Similar decompression was then carried out on the right and then the residual midline structures were removed to complete the bilateral decompression. L1-L2 L3-L4 and L5 nerve roots well visualized well decompressed the central stenosis was completely relieved. Discectomies were then carried out at all levels. There was large herniated disc at L1-2 particular on the right side and this was thoroughly cleaned out. Similar herniated disc was identified and L3 for this was also removed. All disc spaces were thoroughly  cleaned out prepared for interbody fusion. We distracted the disc spaces up to the appropriate height and found a 10 mm height were good at L1-2 and L3-4 and 8 mm Heights were good L2-3 and L4-5. We prepared the disc space for interbody fusion and then impacted peek spacers filled with autologous bone and morselized allograft in all levels. Prior to placing the second graft at each level a mixture of autologous bone and morselized allograft was placed deep within the interspace to help with the interbody fusion. We then placed pedicle screws in standard fashion. Drill hole entry points were placed followed by passing of the ultrasonic pedicle guide. We then tapped with a 6 mm tap and placed 6.5 x 45 mm screws at L1 and L5 bilaterally and 6.5 x 40 Miller screws at L2-L3 and L4 bilaterally except for L3 on the left with the pedicle without adequate to have a screw. We then fashioned rods and secured them to the top of the screws and did tightening and final tightening with torque and counter torque. Final fluoroscopy looked excellent. We irrigated copiously controlled any bleeding with upper coagulation and [place]. Did place a cross-link. We decorticated the far lateral region for posterior lateral fusion placed a mixture of autologous bone and morselized allograft. We placed our internal bone growth stimulator and cushioned well within the graft material to make sure there was not in contact with any of the screws. We made a small pouch for the battery. We left an epidural drain in the epidural space and brought out through separate stab wound incision. We then closed the wound in multiple layers of I Kroll on the fascia subcutaneous and subcuticular tissues. A running proline was placed on the skin. A sterile dressing was then applied and the patient was extubated and taken to recovery room in  stable condition.

## 2015-06-17 ENCOUNTER — Encounter (HOSPITAL_COMMUNITY): Payer: Self-pay | Admitting: Neurosurgery

## 2015-06-17 LAB — BASIC METABOLIC PANEL
ANION GAP: 8 (ref 5–15)
BUN: 23 mg/dL — ABNORMAL HIGH (ref 6–20)
CALCIUM: 8.3 mg/dL — AB (ref 8.9–10.3)
CO2: 21 mmol/L — AB (ref 22–32)
CREATININE: 1.78 mg/dL — AB (ref 0.61–1.24)
Chloride: 105 mmol/L (ref 101–111)
GFR, EST AFRICAN AMERICAN: 45 mL/min — AB (ref >60)
GFR, EST NON AFRICAN AMERICAN: 39 mL/min — AB (ref >60)
Glucose, Bld: 172 mg/dL — ABNORMAL HIGH (ref 65–99)
Potassium: 4.6 mmol/L (ref 3.5–5.1)
SODIUM: 134 mmol/L — AB (ref 135–145)

## 2015-06-17 LAB — TYPE AND SCREEN
ABO/RH(D): O POS
ANTIBODY SCREEN: NEGATIVE
UNIT DIVISION: 0
Unit division: 0

## 2015-06-17 LAB — CBC
HCT: 29.2 % — ABNORMAL LOW (ref 39.0–52.0)
HEMOGLOBIN: 10.2 g/dL — AB (ref 13.0–17.0)
MCH: 31.8 pg (ref 26.0–34.0)
MCHC: 34.9 g/dL (ref 30.0–36.0)
MCV: 91 fL (ref 78.0–100.0)
PLATELETS: 180 10*3/uL (ref 150–400)
RBC: 3.21 MIL/uL — AB (ref 4.22–5.81)
RDW: 14.6 % (ref 11.5–15.5)
WBC: 15.8 10*3/uL — AB (ref 4.0–10.5)

## 2015-06-17 LAB — GLUCOSE, CAPILLARY
GLUCOSE-CAPILLARY: 190 mg/dL — AB (ref 65–99)
GLUCOSE-CAPILLARY: 153 mg/dL — AB (ref 65–99)
Glucose-Capillary: 185 mg/dL — ABNORMAL HIGH (ref 65–99)

## 2015-06-17 MED ORDER — HYDROMORPHONE HCL 2 MG PO TABS
4.0000 mg | ORAL_TABLET | ORAL | Status: DC | PRN
  Administered 2015-06-17 – 2015-06-18 (×5): 4 mg via ORAL
  Filled 2015-06-17 (×5): qty 2

## 2015-06-17 MED FILL — Heparin Sodium (Porcine) Inj 1000 Unit/ML: INTRAMUSCULAR | Qty: 30 | Status: AC

## 2015-06-17 MED FILL — Sodium Chloride IV Soln 0.9%: INTRAVENOUS | Qty: 2000 | Status: AC

## 2015-06-17 NOTE — Progress Notes (Signed)
Patient ID: Jon Velazquez, male   DOB: August 16, 1951, 64 y.o.   MRN: [id number] Afeb, vss No new neuro issues Only has incisional pain. Drain working well. Will transfer to floor, probably d/c tomorrow.

## 2015-06-17 NOTE — Progress Notes (Signed)
Occupational Therapy Evaluation Patient Details Name: Audi Wettstein MRN: [id number] DOB: May 18, 1951 Today's Date: 06/17/2015    History of Present Illness pt is a 64 y/o male admitted for back and buttock pain radiating into the legs bilaterally.  He is s/p bil L1-5 microdiscectomies with  PLIF and PLA.   Clinical Impression   PTA, pt mod I with ADL and mobility. Plans to D/C home. Will benefit from acute OT to increase level of independence with ADL and functional mobility for ADL to facilitate safe D/C home with son. Recommend initial 24/7 S if possible.    Follow Up Recommendations  Supervision/Assistance - 24 hour;Home health OT (pt declines HH therapy)    Equipment Recommendations  None recommended by OT    Recommendations for Other Services       Precautions / Restrictions Precautions Precautions: Fall;Back Precaution Booklet Issued: Yes (comment) Precaution Comments: reviewed handout with pt Required Braces or Orthoses: Spinal Brace Spinal Brace: Lumbar corset      Mobility Bed Mobility               General bed mobility comments: in chair already;Pt demonstrated technique  Transfers Overall transfer level: Needs assistance Equipment used: None Transfers: Sit to/from Stand Sit to Stand: Min guard;Supervision         General transfer comment: effortful and painful stand with pt adamant that no one touch him to assist    Balance     Sitting balance-Leahy Scale: Fair       Standing balance-Leahy Scale: Fair                              ADL Overall ADL's : Needs assistance/impaired     Grooming: Set up;Supervision/safety   Upper Body Bathing: Minimal assitance;Cueing for safety   Lower Body Bathing: Moderate assistance;Sit to/from stand   Upper Body Dressing : Minimal assistance   Lower Body Dressing: Moderate assistance;Sit to/from stand   Toilet Transfer: Min guard;Ambulation   Toileting- Clothing Manipulation and  Hygiene: Moderate assistance       Functional mobility during ADLs: Supervision/safety;Cueing for safety;Cueing for sequencing General ADL Comments: Educated pt on back precautions for ADL. Pt reports he is able to cross ankles over knees at baseline but unable to on eval. Pt not following instructions to adhere for back precautions during mobility and refused to use RW to help with gait.      Vision     Perception     Praxis      Pertinent Vitals/Pain Pain Assessment: 0-10 Pain Score: 9  Pain Location: back Pain Descriptors / Indicators: Aching;Discomfort;Grimacing Pain Intervention(s): Limited activity within patient's tolerance;Monitored during session     Hand Dominance     Extremity/Trunk Assessment Upper Extremity Assessment Upper Extremity Assessment: Overall WFL for tasks assessed   Lower Extremity Assessment Lower Extremity Assessment: Defer to PT evaluation   Cervical / Trunk Assessment Cervical / Trunk Assessment: Kyphotic   Communication Communication Communication: No difficulties   Cognition Arousal/Alertness: Awake/alert Behavior During Therapy: Agitated Overall Cognitive Status: Within Functional Limits for tasks assessed                     General Comments       Exercises       Shoulder Instructions      Home Living Family/patient expects to be discharged to:: Private residence Living Arrangements: Other (Comment) (son lives with him) Available Help at Discharge:  Family;Available PRN/intermittently Type of Home: House Home Access: Stairs to enter [employer] of Steps: 3 Entrance Stairs-Rails: Left Home Layout: One level     Bathroom Shower/Tub: Producer, television/film/video: Handicapped height     Home Equipment: Shower seat - built in;Toilet riser          Prior Functioning/Environment Level of Independence: Independent        Comments: walked "hunched over"    OT Diagnosis: Generalized  weakness;Acute pain   OT Problem List: Decreased strength;Decreased range of motion;Decreased activity tolerance;Impaired balance (sitting and/or standing);Decreased safety awareness;Decreased knowledge of use of DME or AE;Decreased knowledge of precautions;Pain   OT Treatment/Interventions: Self-care/ADL training;DME and/or AE instruction;Therapeutic activities;Patient/family education;Balance training    OT Goals(Current goals can be found in the care plan section) Acute Rehab OT Goals Patient Stated Goal: feel better OT Goal Formulation: With patient Time For Goal Achievement: 06/24/15 Potential to Achieve Goals: Good  OT Frequency: Min 3X/week   Barriers to D/C:            Co-evaluation              End of Session Equipment Utilized During Treatment: Back brace Nurse Communication: Mobility status  Activity Tolerance: Patient limited by pain Patient left: in chair;with call bell/phone within reach   Time: 1052-1115 OT Time Calculation (min): 23 min Charges:  OT General Charges $OT Visit: 1 Procedure OT Evaluation $Initial OT Evaluation Tier I: 1 Procedure G-Codes:    WARD,HILLARY 06/26/2015, 6:32 PM   [facility], OTR/L  [phone] 06-26-15

## 2015-06-17 NOTE — Care Management Note (Signed)
Case Management Note  Patient Details  Name: Jon Velazquez MRN: [id number] Date of Birth: June 24, 1951  Subjective/Objective:    Pt s/p spinal surgery on 06/16/15.  PTA, pt independent; son lives with him.               Action/Plan: PT evaluation done today; recommending no OP follow up.  Will continue to follow for dc needs.    Expected Discharge Date:                  Expected Discharge Plan:  Home/Self Care  In-House Referral:     Discharge planning Services  CM Consult  Post Acute Care Choice:    Choice offered to:     DME Arranged:    DME Agency:     HH Arranged:    HH Agency:     Status of Service:  In process, will continue to follow  Medicare Important Message Given:    Date Medicare IM Given:    Medicare IM give by:    Date Additional Medicare IM Given:    Additional Medicare Important Message give by:     If discussed at Long Length of Stay Meetings, dates discussed:    Additional Comments:  Quintella Baton, RN, BSN  Trauma/Neuro ICU Case Manager [phone]

## 2015-06-17 NOTE — Evaluation (Signed)
Physical Therapy Evaluation Patient Details Name: Jon Velazquez MRN: [id number] DOB: 1951-01-19 Today's Date: 06/17/2015   History of Present Illness  pt is a 64 y/o male admitted for back and buttock pain radiating into the legs bilaterally.  He is s/p bil L1-5 microdiscectomies with  PLIF and PLA of L 1-5.  Clinical Impression  Pt admitted with/for lumbar fusion surger.  Pt currently limited functionally due to the problems listed below.  (see problems list.)  Pt will benefit from PT to maximize function and safety to be able to get home safely with available assist of family.     Follow Up Recommendations No PT follow up;Supervision for mobility/OOB    Equipment Recommendations  Rolling walker with 5" wheels    Recommendations for Other Services       Precautions / Restrictions Precautions Precautions: Fall      Mobility  Bed Mobility               General bed mobility comments: in chair already; demonstrated safe technique, will work on next session  Transfers Overall transfer level: Needs assistance Equipment used: None Transfers: Sit to/from Stand Sit to Stand: Min guard         General transfer comment: effortful and painful stand with pt adamant that no one touch him to assist  Ambulation/Gait Ambulation/Gait assistance: Min guard Ambulation Distance (Feet): 150 Feet Assistive device: None (iv pole) Gait Pattern/deviations: Step-through pattern;Decreased step length - right;Decreased step length - left;Trunk flexed   Gait velocity interpretation: Below normal speed for age/gender General Gait Details: weak, staggery steps, with flexed posture, but pt not wanting any assist  Stairs            Wheelchair Mobility    Modified Rankin (Stroke Patients Only)       Balance Overall balance assessment: Needs assistance Sitting-balance support: No upper extremity supported Sitting balance-Leahy Scale: Fair     Standing balance support: No  upper extremity supported Standing balance-Leahy Scale: Fair                               Pertinent Vitals/Pain Pain Assessment: 0-10 Pain Score: 9  Pain Location: back Pain Descriptors / Indicators: Aching;Grimacing;Guarding Pain Intervention(s): Limited activity within patient's tolerance;Premedicated before session    Home Living Family/patient expects to be discharged to:: Private residence Living Arrangements: Other (Comment) (son lives with him) Available Help at Discharge: Family;Available PRN/intermittently Type of Home: House Home Access: Stairs to enter Entrance Stairs-Rails: Left Entrance Stairs-Number of Steps: 3 Home Layout: One level Home Equipment: Shower seat - built in      Prior Function Level of Independence: Independent               Hand Dominance        Extremity/Trunk Assessment   Upper Extremity Assessment: Defer to OT evaluation           Lower Extremity Assessment: Overall WFL for tasks assessed;Generalized weakness (bil proximal and lower trunk weaknesses)         Communication   Communication: No difficulties  Cognition Arousal/Alertness: Awake/alert Behavior During Therapy: Agitated Overall Cognitive Status: Within Functional Limits for tasks assessed                      General Comments General comments (skin integrity, edema, etc.): pt instructed in back care/prec, demo'd log roll and transition to/from sit, lifting restrictions and typical progression  of activity.    Exercises        Assessment/Plan    PT Assessment Patient needs continued PT services  PT Diagnosis Difficulty walking;Acute pain   PT Problem List Decreased strength;Decreased activity tolerance;Decreased mobility;Pain;Decreased knowledge of use of DME;Decreased knowledge of precautions  PT Treatment Interventions Gait training;DME instruction;Stair training;Functional mobility training;Therapeutic activities;Patient/family  education   PT Goals (Current goals can be found in the Care Plan section) Acute Rehab PT Goals Patient Stated Goal: feel better PT Goal Formulation: With patient Time For Goal Achievement: 06/24/15 Potential to Achieve Goals: Good    Frequency Min 5X/week   Barriers to discharge Decreased caregiver support      Co-evaluation               End of Session Equipment Utilized During Treatment: Back brace Activity Tolerance: Patient tolerated treatment well;No increased pain Patient left: in chair;with call bell/phone within reach Nurse Communication: Mobility status         Time: 1610-9604 PT Time Calculation (min) (ACUTE ONLY): 23 min   Charges:   PT Evaluation $Initial PT Evaluation Tier I: 1 Procedure     PT G Codes:        Mottinger, Eliseo Gum 06/17/2015, 12:20 PM 06/17/2015  [place], PT [phone]  (pager)

## 2015-06-18 LAB — GLUCOSE, CAPILLARY: Glucose-Capillary: 152 mg/dL — ABNORMAL HIGH (ref 65–99)

## 2015-06-18 MED ORDER — HYDROMORPHONE HCL 4 MG PO TABS: 4.0000 mg | ORAL_TABLET | ORAL | Status: DC | PRN

## 2015-06-18 MED ORDER — TIZANIDINE HCL 4 MG PO TABS: 4.0000 mg | ORAL_TABLET | Freq: Three times a day (TID) | ORAL | Status: DC

## 2015-06-18 NOTE — Progress Notes (Signed)
Patient ID: Jon Velazquez, male   DOB: 1951-05-05, 64 y.o.   MRN: [id number] Patient doing well condition back pain but no leg pain  Strength out of 5 wound clean dry and intact  Plan discharge home

## 2015-06-18 NOTE — Progress Notes (Signed)
PT Cancellation Note  Patient Details Name: Jon Velazquez MRN: [id number] DOB: Dec 12, 1950   Cancelled Treatment:    Reason Eval/Treat Not Completed: Other (comment) (Not in room at this time. Will check back later)   Berton Mount 06/18/2015, 11:16 AM  Charlsie Merles, PT [phone]

## 2015-06-18 NOTE — Progress Notes (Signed)
DC instructions, and prescription given to patient/ sister. Educated them that patient remains very unsteady on his feet and refused any home health interventions. Reinforced back precautions. Encouraged to call MD office on Monday the 26th for F/U appt, when to remove dressing for shower, any other questions or concerns. Patient in [facility] escorted by volunteer to lobby. Patient to be transported by sister in private vehicle to home.

## 2015-06-18 NOTE — Discharge Instructions (Signed)
No lifting no bending no twisting no driving a riding a car unless he is coming back and forth to see me. Keep incision clean dry and intact. °

## 2015-06-18 NOTE — Progress Notes (Signed)
Occupational Therapy Treatment Patient Details Name: Jon Velazquez MRN: [id number] DOB: 04-16-1951 Today's Date: 06/18/2015    History of present illness pt is a 64 y.o. male admitted for back and buttock pain radiating into the legs bilaterally.  He is s/p bil L1-5 microdiscectomies with  PLIF and PLA.   OT comments  Pt agitated in session and planning to leave today.   Follow Up Recommendations  Supervision/Assistance - 24 hour;Home health OT (pt declining HHOT)   Equipment Recommendations  None recommended by OT    Recommendations for Other Services      Precautions / Restrictions Precautions Precautions: Fall;Back Precaution Booklet Issued: No Precaution Comments: educated/reviewed precautions Required Braces or Orthoses: Spinal Brace Spinal Brace: Lumbar corset;Applied in sitting position Restrictions Weight Bearing Restrictions: No       Mobility Bed Mobility Overal bed mobility: Needs Assistance Bed Mobility: Rolling;Sidelying to Sit;Sit to Sidelying Rolling: Modified independent (Device/Increase time) Sidelying to sit: Supervision     Sit to sidelying: Supervision    Comments: HOB elevated and used rails. Explained that at home he could use side of mattress.  Transfers Overall transfer level: Needs assistance Equipment used: None Transfers: Sit to/from Stand Sit to Stand: Supervision         General transfer comment: pt agitated and did not want therapist touching him. OT tried to stay close to pt for safety.    Balance  Pt unsteady on feet.                                  ADL Overall ADL's : Needs assistance/impaired     Grooming: Wash/dry face;Set up;Supervision/safety;Standing           Upper Body Dressing : Set up;Supervision/safety;Sitting Upper Body Dressing Details (indicate cue type and reason): back brace Lower Body Dressing: Set up;Supervision/safety;Sitting/lateral leans (donned socks)   Toilet Transfer:  Supervision/safety;Ambulation (sit to stand from bed and chair)           Functional mobility during ADLs: Supervision/safety General ADL Comments: Discussed safety. Discussed back brace. Discussed incorporating precautions into functional activities.       Vision                     Perception     Praxis      Cognition  Awake/Alert Behavior During Therapy: Agitated Overall Cognitive Status: No family/caregiver present to determine baseline cognitive functioning (decreased safety awareness and memory)       Memory: Decreased recall of precautions;Decreased short-term memory               Extremity/Trunk Assessment               Exercises     Shoulder Instructions       General Comments      Pertinent Vitals/ Pain       Pain Assessment: 0-10 Pain Score: 10-Worst pain ever Pain Location: back when moving Pain Intervention(s): Monitored during session  Home Living                                          Prior Functioning/Environment              Frequency Min 3X/week     Progress Toward Goals  OT Goals(current goals can now be found in the  care plan section)  Progress towards OT goals: Progressing toward goals  Acute Rehab OT Goals Patient Stated Goal: go home OT Goal Formulation: With patient Time For Goal Achievement: 06/24/15 Potential to Achieve Goals: Good ADL Goals Pt Will Perform Lower Body Bathing: with supervision;with set-up;with adaptive equipment;sit to/from stand;with caregiver independent in assisting Pt Will Perform Lower Body Dressing: with set-up;with supervision;with adaptive equipment;with caregiver independent in assisting;sit to/from stand Pt Will Transfer to Toilet: with supervision;ambulating;bedside commode Pt Will Perform Toileting - Clothing Manipulation and hygiene: with supervision;sit to/from stand;sitting/lateral leans;with adaptive equipment Additional ADL Goal #1: Pt independently  verbalize use of 3 back precautions for ADL  Plan Discharge plan remains appropriate    Co-evaluation                 End of Session Equipment Utilized During Treatment: Back brace (refused gait belt and yellow socks for part of session)   Activity Tolerance Patient tolerated treatment well   Patient Left in bed;with call bell/phone within reach;with bed alarm set   Nurse Communication Mobility status;Other (comment) (saw him up earlier alone)        Time: 1610-9604 OT Time Calculation (min): 16 min  Charges: OT General Charges $OT Visit: 1 Procedure OT Treatments $Self Care/Home Management : 8-22 mins  Earlie Raveling OTR/L [phone] 06/18/2015, 10:24 AM

## 2015-06-18 NOTE — Discharge Summary (Signed)
  Physician Discharge Summary  Patient ID: Jon Velazquez MRN: [id number] DOB/AGE: 12/07/1950 64 y.o.  Admit date: 06/16/2015 Discharge date: 06/18/2015  Admission Diagnoses: Lumbar spondylosis and stenosis  Discharge Diagnoses: Same Active Problems:   Lumbar stenosis with neurogenic claudication   Discharged Condition: good  Hospital Course: Patient was admitted hospital underwent decompressive laminectomy and interbody fusion from L1 L5 postoperative did fairly well was angling and voiding spontaneously on the floor tolerating regular diet and was stable for discharge home on postoperative day 2. Patient will be discharged scheduled follow-up in one to 2 weeks.  Consults: Significant Diagnostic Studies: Treatments: L1 L5 decompression fusion Discharge Exam: Blood pressure 128/68, pulse 89, temperature 100.4 F (38 C), temperature source Oral, resp. rate 18, height  (1.854 m), weight 98.521 kg (217 lb 3.2 oz), SpO2 98 %. Strength out of 5 wound clean dry and intact  Disposition: Home     Medication List    TAKE these medications        aspirin EC 325 MG tablet  Take 325 mg by mouth daily.     benazepril 40 MG tablet  Commonly known as:  LOTENSIN  Take 40 mg by mouth daily.     calcium carbonate 600 MG Tabs tablet  Commonly known as:  OS-CAL  Take 600 mg by mouth daily.     chlorthalidone 25 MG tablet  Commonly known as:  HYGROTON  Take 25 mg by mouth daily.     Fish Oil 1000 MG Caps  Take 1,000 mg by mouth daily.     gemfibrozil 600 MG tablet  Commonly known as:  LOPID  Take 600 mg by mouth 2 (two) times daily.     glyBURIDE 5 MG tablet  Commonly known as:  DIABETA  Take 5 mg by mouth 2 (two) times daily.     HYDROmorphone 4 MG tablet  Commonly known as:  DILAUDID  Take 1 tablet (4 mg total) by mouth every 3 (three) hours as needed for severe pain.     metFORMIN 1000 MG tablet  Commonly known as:  GLUCOPHAGE  Take 1,000 mg by mouth 2 (two) times  daily.     simvastatin 10 MG tablet  Commonly known as:  ZOCOR  Take 10 mg by mouth daily.     sitaGLIPtin 100 MG tablet  Commonly known as:  JANUVIA  Take 100 mg by mouth daily.     tiZANidine 4 MG tablet  Commonly known as:  ZANAFLEX  Take 1 tablet (4 mg total) by mouth 3 (three) times daily.     Vitamin D-3 1000 UNITS Caps  Take 1,000 Units by mouth daily.           Follow-up Information    Follow up with Reinaldo Meeker, MD.   Specialty:  Neurosurgery   Contact information:   [address] [place] [address] [phone]       Signed: Mariam Dollar 06/18/2015, 8:12 AM

## 2017-04-10 ENCOUNTER — Other Ambulatory Visit: Payer: Self-pay

## 2017-04-10 DIAGNOSIS — I739 Peripheral vascular disease, unspecified: Secondary | ICD-10-CM

## 2017-05-10 ENCOUNTER — Encounter: Payer: Self-pay | Admitting: Vascular Surgery

## 2017-05-22 ENCOUNTER — Encounter: Payer: Self-pay | Admitting: Vascular Surgery

## 2017-05-22 ENCOUNTER — Ambulatory Visit (INDEPENDENT_AMBULATORY_CARE_PROVIDER_SITE_OTHER): Payer: Medicare FFS | Admitting: Vascular Surgery

## 2017-05-22 ENCOUNTER — Ambulatory Visit (HOSPITAL_COMMUNITY)
Admission: RE | Admit: 2017-05-22 | Discharge: 2017-05-22 | Disposition: A | Discharge status: Home / Self Care | Payer: Medicare FFS | Source: Ambulatory Visit | Attending: Vascular Surgery | Admitting: Vascular Surgery

## 2017-05-22 ENCOUNTER — Encounter (HOSPITAL_COMMUNITY): Payer: Medicare FFS

## 2017-05-22 ENCOUNTER — Encounter: Payer: Medicare FFS | Admitting: Vascular Surgery

## 2017-05-22 VITALS — BP 123/73 | HR 69 | Temp 97.3°F | Ht 72.0 in | Wt 207.4 lb

## 2017-05-22 DIAGNOSIS — I739 Peripheral vascular disease, unspecified: Secondary | ICD-10-CM | POA: Insufficient documentation

## 2017-05-22 LAB — VAS US LOWER EXTREMITY ARTERIAL DUPLEX
LATIBDISTSYS: 44 cm/s
LEFT PERO DIST SYS: 35 cm/s
LSFDPSV: -69 cm/s
Left super femoral mid sys PSV: -168 cm/s
Left super femoral prox sys PSV: 233 cm/s
RIGHT ANT DIST TIBAL SYS PSV: 21 cm/s
RSFMPSV: -116 cm/s
RSFPPSV: -76 cm/s
RTIBDISTSYS: 32 cm/s
Right peroneal sys PSV: 11 cm/s
Right super femoral dist sys PSV: -81 cm/s
left post tibial dist sys: 38 cm/s

## 2017-05-22 NOTE — Progress Notes (Signed)
Patient name: Jon Velazquez MRN: [id number] DOB: May 08, 1951 Sex: male   REASON FOR CONSULT:    Peripheral vascular disease. The consult is requested by Dr. Olena Leatherwood.  HPI:   Jon Velazquez is a pleasant 66 y.o. male,  Who is referred for evaluation of peripheral vascular disease. According to the records the patient has had previous angioplasty elsewhere.   The patient describes having pain in both lower extremities which began approximately a year ago. The symptoms came on gradually area the pain radiates down the posterior aspect of both legs down to his calves. The symptoms are equal on both sides. The symptoms have been stable over the last year. He does describe some calf claudication. However, he also experiences pain in his legs with simply standing. I do not get any history of rest pain or history of nonhealing ulcers. He's had previous back surgery.  I have reviewed the records that were sent from [facility]. The patient has a history of diabetes. His last hemoglobin A1c showed good control. The patient also has a history of mixed hyperlipidemia and stage II chronic kidney disease. The patient's essential hypertension has also been under good control.   The patient did have a noninvasive study done in [facility]. I reviewed this study. ABI on the right was 47% ABI on the left 56%.  Past Medical History:  Diagnosis Date  . COPD (chronic obstructive pulmonary disease) (HCC)   . Diabetes mellitus without complication (HCC)    type 2  . GERD (gastroesophageal reflux disease)   . H/O fracture of ankle   . Hyperlipidemia   . Hypertension     Family History  Problem Relation Age of Onset  . Lung cancer Father   . Cancer Sister     SOCIAL HISTORY: Social History   Social History  . Marital status: Divorced    Spouse name: N/A  . Number of children: N/A  . Years of education: N/A   Occupational History  . Not on file.   Social  History Main Topics  . Smoking status: Current Every Day Smoker    Packs/day: 1.50    Years: 50.00    Types: Cigarettes  . Smokeless tobacco: Never Used  . Alcohol use No  . Drug use: Unknown  . Sexual activity: Not on file   Other Topics Concern  . Not on file   Social History Narrative  . No narrative on file    No Known Allergies  Current Outpatient Prescriptions  Medication Sig Dispense Refill  . aspirin EC 325 MG tablet Take 325 mg by mouth daily.     . benazepril (LOTENSIN) 40 MG tablet Take 40 mg by mouth daily.     . calcium carbonate (OS-CAL) 600 MG TABS tablet Take 600 mg by mouth daily.     . chlorthalidone (HYGROTON) 25 MG tablet Take 25 mg by mouth daily.     . Cholecalciferol (VITAMIN D-3) 1000 UNITS CAPS Take 1,000 Units by mouth daily.     . clopidogrel (PLAVIX) 75 MG tablet     . gemfibrozil (LOPID) 600 MG tablet Take 600 mg by mouth 2 (two) times daily.     Marland Kitchen LANTUS SOLOSTAR 100 UNIT/ML Solostar Pen Inject 40 Units into the skin daily after breakfast.     . magnesium oxide (MAG-OX) 400 (241.3 Mg) MG tablet     . metoprolol tartrate (LOPRESSOR) 25 MG tablet     . NOVOLOG FLEXPEN 100 UNIT/ML  FlexPen Inject 10 Units into the skin.     . Omega-3 Fatty Acids (FISH OIL) 1000 MG CAPS Take 1,000 mg by mouth daily.     . simvastatin (ZOCOR) 10 MG tablet Take 10 mg by mouth daily.     . sitaGLIPtin (JANUVIA) 100 MG tablet Take 100 mg by mouth daily.      No current facility-administered medications for this visit.     REVIEW OF SYSTEMS:  [X]  denotes positive finding, [ ]  denotes negative finding Cardiac  Comments:  Chest pain or chest pressure:    Shortness of breath upon exertion:    Short of breath when lying flat:    Irregular heart rhythm:        Vascular    Pain in calf, thigh, or hip brought on by ambulation: X   Pain in feet at night that wakes you up from your sleep:     Blood clot in your veins:    Leg swelling:         Pulmonary    Oxygen at  home:    Productive cough:     Wheezing:         Neurologic    Sudden weakness in arms or legs:     Sudden numbness in arms or legs:     Sudden onset of difficulty speaking or slurred speech:    Temporary loss of vision in one eye:     Problems with dizziness:         Gastrointestinal    Blood in stool:     Vomited blood:         Genitourinary    Burning when urinating:     Blood in urine:        Psychiatric    Major depression:         Hematologic    Bleeding problems:    Problems with blood clotting too easily:        Skin    Rashes or ulcers:        Constitutional    Fever or chills:     PHYSICAL EXAM:   Vitals:   05/22/17 1040  BP: 123/73  Pulse: 69  Temp: (!) 97.3 F (36.3 C)  Weight: 207 lb 6.4 oz (94.1 kg)  Height: 6' (1.829 m)    GENERAL: The patient is a well-nourished male, in no acute distress. The vital signs are documented above. CARDIAC: There is a regular rate and rhythm.  VASCULAR: I do not detect carotid bruits. He has diminished but palpable femoral pulses bilaterally. I cannot palpate pedal pulses. Both feet are warm and well-perfused. He has no significant lower extremity swelling. PULMONARY: There is good air exchange bilaterally without wheezing or rales. ABDOMEN: Soft and non-tender with normal pitched bowel sounds. I cannot palpate an abdominal aortic aneurysm although his abdomen is large and difficult to assess. MUSCULOSKELETAL: There are no major deformities or cyanosis. NEUROLOGIC: No focal weakness or paresthesias are detected. SKIN: There are no ulcers or rashes noted. PSYCHIATRIC: The patient has a normal affect.  DATA:    BILATERAL LOWER EXTREMITY ARTERIAL DUPLEX: I have independently interpreted his bilateral lower extremity arterial duplex scan.  On the right side there is a monophasic common femoral artery waveform and monophasic signals distally. The patient has evidence of inflow disease on the right.  On the left  side there is a monophasic common femoral artery waveform is monophasic signals distally. There is evidence of a 50-74% proximal  left posterior tibial artery stenosis also.  ARTERIAL DOPPLER STUDY: I reviewed the arterial Doppler study [facility] which showed an ABI 47% on the right and 56% on the left.  CT LUMBAR SPINE: I reviewed the CT scan of the lumbar spine that was done which shows multilevel degenerative disc disease. I also note some calcium in the aorta and bilateral common iliac arteries.  LABS: Creatinine in September 2016 was 1.78. GFR was 39.  MEDICAL ISSUES:   PERIPHERAL VASCULAR DISEASE: This patient does have evidence of multilevel arterial occlusive disease. He also has significant degenerative disc disease of his back and his activity seems to be limited more so from his back issues. With respect to his peripheral vascular disease, his claudication symptoms are stable and he has no rest pain or nonhealing ulcers. This reason I would recommend conservative treatment. We have discussed the importance of tobacco cessation. We've also discussed the importance of getting on a structured walking program and the importance of nutrition. If he developed disabling claudication, rest pain, or nonhealing ulcer than we would consider arteriography and I have discussed this briefly. I will be happy to see him back any time if his symptoms progress. Currently however he does not wish to pursue any further vascular workup.  Waverly Ferrariickson, Jon Velazquez [facility] [phone]

## 2017-05-28 ENCOUNTER — Encounter: Payer: Self-pay | Admitting: Internal Medicine

## 2017-06-24 IMAGING — RF DG MYELOGRAPHY LUMBAR INJ LUMBOSACRAL
11 of 16 series · 11 of 16 positions shown · non-contrast
Comparison: None

CLINICAL DATA: Previous L5-S1 decompression.  Lumbar radiculopathy.
TECHNIQUE: Contiguous axial images were obtained through the Lumbar spine after
the intrathecal infusion of infusion. Coronal and sagittal
reconstructions were obtained of the axial image sets.

[Series 1: (hospital) · 1 of 1 slices shown]
[im 1/1]
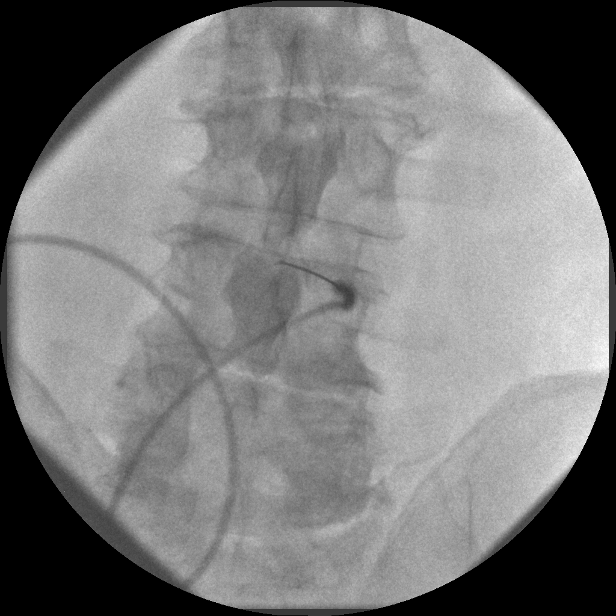

[Series 3: myelogram  white · 1 of 1 slices shown (1 of 10)]
[im 1/1]
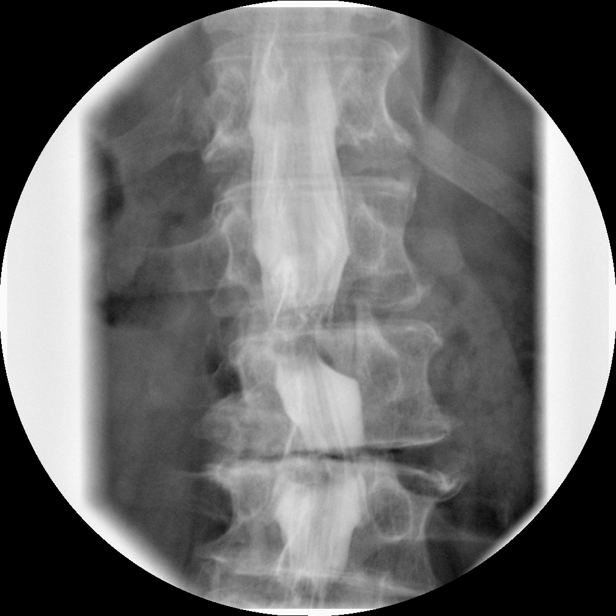

[Series 4: myelogram  white · 1 of 1 slices shown (2 of 10)]
[im 1/1]
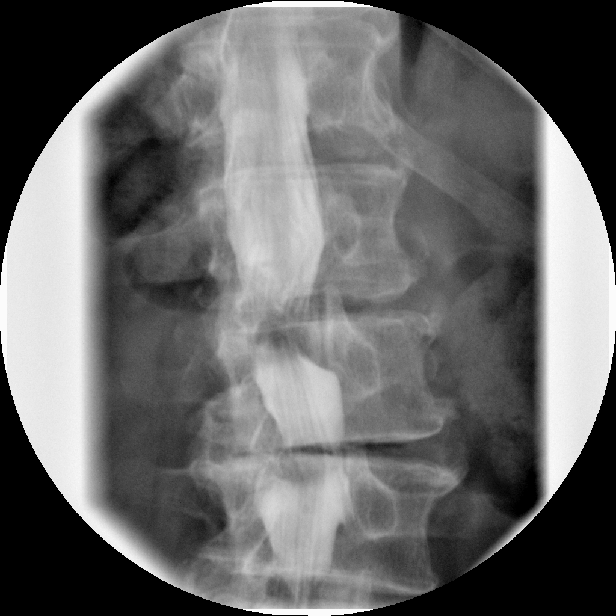

[Series 6: myelogram  white · 1 of 1 slices shown (3 of 10)]
[im 1/1]
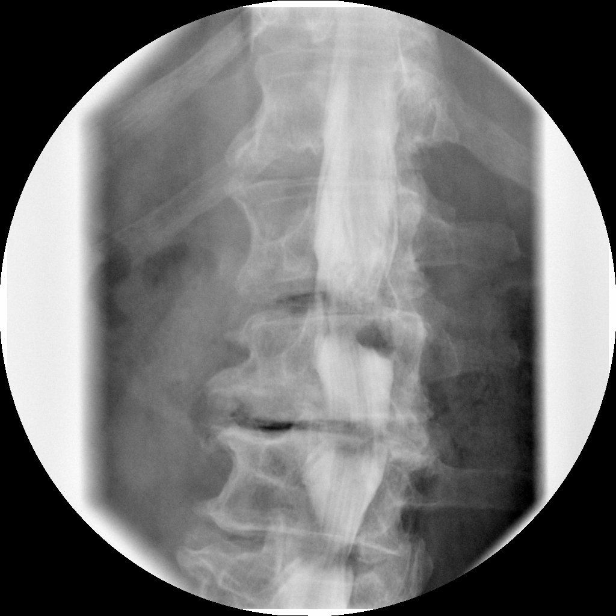

[Series 7: myelogram  white · 1 of 1 slices shown (4 of 10)]
[im 1/1]
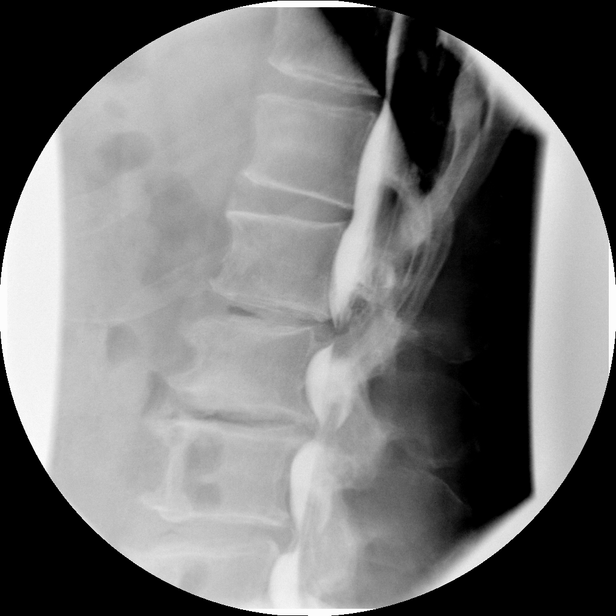

[Series 9: myelogram  white · 1 of 1 slices shown (5 of 10)]
[im 1/1]
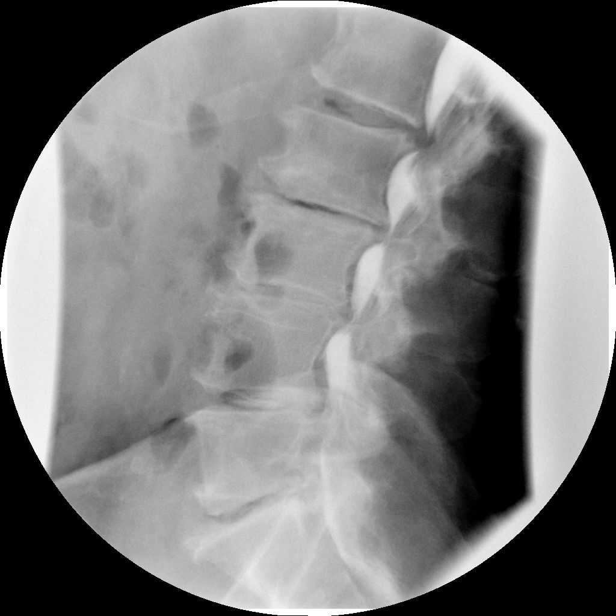

[Series 10: myelogram  white · 1 of 1 slices shown (6 of 10)]
[im 1/1]
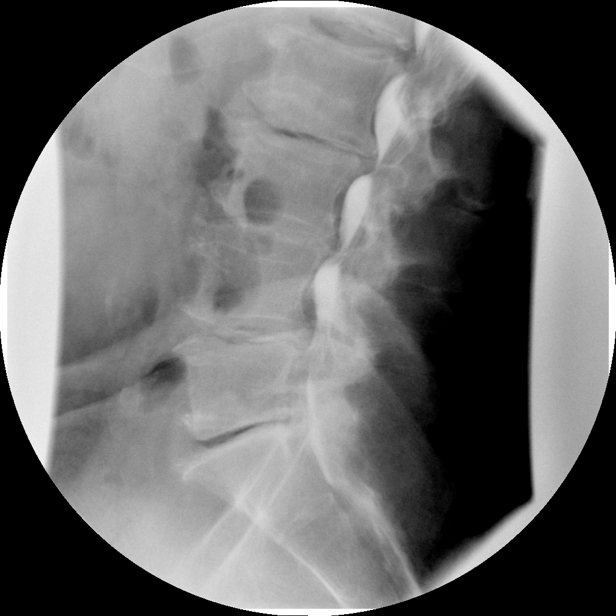

[Series 11: myelogram  white · 1 of 1 slices shown (7 of 10)]
[im 1/1]
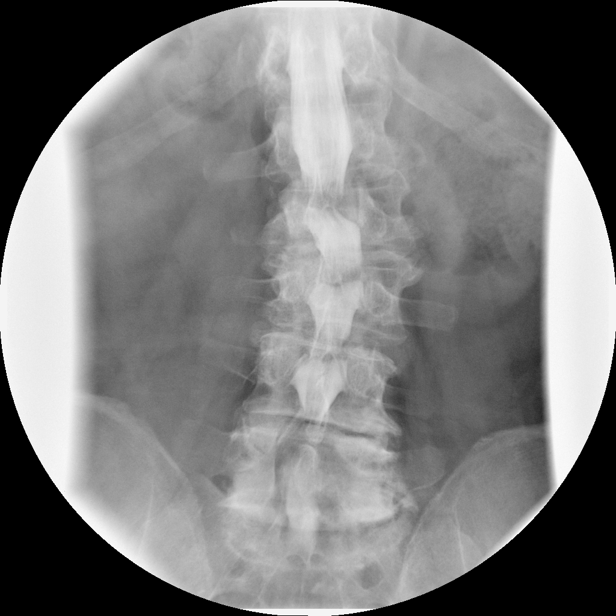

[Series 13: myelogram  white · 1 of 1 slices shown (8 of 10)]
[im 1/1]
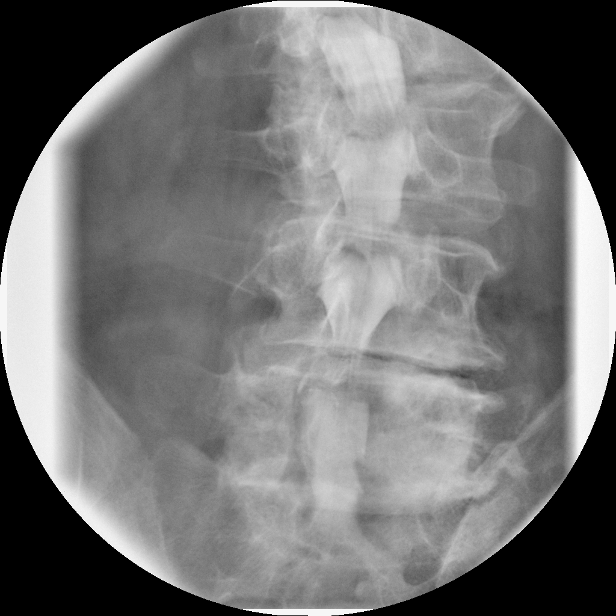

[Series 14: myelogram  white · 1 of 1 slices shown (9 of 10)]
[im 1/1]
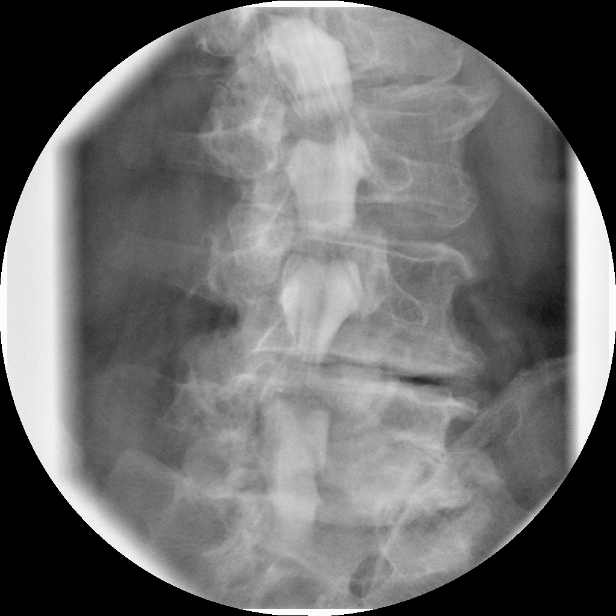

[Series 16: myelogram  white · 1 of 1 slices shown (10 of 10)]
[im 1/1]
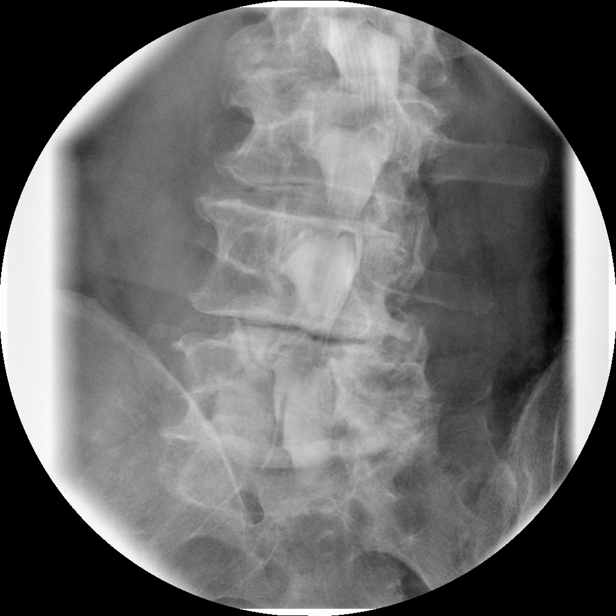

[11 of 16 positions shown; findings below may reference images not displayed]

EXAM:
LUMBAR MYELOGRAM

FLUOROSCOPY TIME:  1 minutes 6 seconds

Fifteen fluoroscopic spot films were obtained.

PROCEDURE:
After thorough discussion of risks and benefits of the procedure
including bleeding, infection, injury to nerves, blood vessels,
adjacent structures as well as headache and CSF leak, written and
oral informed consent was obtained. Consent was obtained by Dr.
Marquel Fray. Time out form was completed.

Patient was positioned prone on the fluoroscopy table. Local
anesthesia was provided with 1% lidocaine without epinephrine after
prepped and draped in the usual sterile fashion. Puncture was
performed at L3-L4 using a 3 1/2 inch 22-gauge spinal needle via
RIGHT paramedian (L2-L3 was initially attempted however access was
unable to be obtained). Approach. Using a single pass through the
dura, the needle was placed within the thecal sac, with return of
clear CSF. 15 mL of Cmnipaque-ON2 was injected into the thecal sac,
with normal opacification of the nerve roots and cauda equina
consistent with free flow within the subarachnoid space.

I personally performed the lumbar puncture and administered the
intrathecal contrast. I also personally supervised acquisition of
the myelogram images.
FINDINGS: LUMBAR MYELOGRAM FINDINGS:

Standing upright frontal projections show a dextroconvex scoliosis
with the apex at L2-L3. Rotatory component of scoliosis. Asymmetric
disc collapse on the LEFT at L2-L3 and L3-L4. Mild leftward slip of
L1 on L2 measuring 7 mm.

On the lateral projections, there is loss of the normal lumbar
lordosis. Grade I retrolisthesis of L3 on L4 measures 6 mm. There is
no pathologic motion with flexion and extension however there is
also no range of motion on the flexion and extension views. The
spinous position the same and flexion, neutral and extension. L1-L2
and L2-L3 moderate central stenosis is present. Vacuum disc present
at L1-L2, L2-L3 and probably at L3-L4. Bilateral lateral recess
stenosis is present at L1-L2 and L2-L3. Lateral recess stenosis is
also present at L3-L4. L4-L5 transverse narrowing of the thecal sac
with vacuum disc.

CT LUMBAR MYELOGRAM FINDINGS:

Segmentation: 5 lumbar type vertebral bodies.

Alignment: Rotoscoliosis again noted. Apex is at L2-L3.
Retrolisthesis of L 2 on L3 measures about 3 mm on CT. Trace
retrolisthesis of L1 on L2. Grade I retrolisthesis of L5 on S1.

Vertebrae: No aggressive osseous lesions. Severe degenerative
endplate changes. Negative for compression fracture.

Conus medullaris: Mixed injection is present with subdural contrast
dorsal the T12 and L1. Intrathecal opacification is normal. Conus
medullaris is at L1-L2.

Paraspinal tissues: RIGHT common iliac artery ectasia measuring 19
mm. Aortoiliofemoral atherosclerosis.

Disc levels:

T11-T12: Schmorl's nodes. No stenosis. Bilateral facet arthrosis
without resulting stenosis.

T12-L1: Disc degeneration. Facet joints appear normal. No stenosis.
Anterior annular calcification.

L1-L2: Moderate multifactorial central stenosis. The disc is
severely degenerated. There is a broad-based central disc
protrusion. Posterior ligamentum flavum redundancy is also present.
Bilateral subarticular stenosis. Mild symmetric bilateral foraminal
stenosis associated with loss of disc height and trace
retrolisthesis.

L2-L3: Severe central stenosis. Near effacement of the subarachnoid
space. This is due to a combination of retrolisthesis, endplate
osteophytes and broad-based disc bulging. Posterior ligamentum
flavum redundancy exacerbates the central stenosis. Bilateral
subarticular stenosis is present, greater on the LEFT when compared
to the RIGHT. There is moderate LEFT foraminal stenosis due to
endplate spurring and facet spurring. Mild RIGHT foraminal stenosis
due to disc bulging.

L3-L4: Moderate multifactorial central stenosis. Degenerated disc
with vacuum disc. Annular calcification. Broad-based posterior
annular bulging. Bilateral facet arthrosis and hypertrophy. Mild
LEFT foraminal stenosis associated with endplate and facet spurring.
RIGHT foramen appears adequately patent.

L4-L5: Severe disc degeneration. Severe facet arthrosis with over
growth. Moderate to severe central stenosis is present. This is
multifactorial. There is a central disc extrusion which is partially
calcified. Posterior ligamentum flavum redundancy eccentric to the
RIGHT. RIGHT-greater-than-LEFT bilateral subarticular stenosis.
Moderate to severe RIGHT and moderate LEFT foraminal stenosis.

L5-S1: LEFT laminotomy. Central canal is decompressed.
RIGHT-greater-than- LEFT subarticular stenosis associated with
broad-based posterior calcified disc protrusion. Bilateral foraminal
stenosis is moderate to severe. Facet spurring, endplate spurring
and retrolisthesis all contributes to the bilateral foraminal
stenosis. Severe bilateral facet arthrosis.
IMPRESSION: LUMBAR MYELOGRAM IMPRESSION:

1. Technically successful L3-L4 lumbar puncture for lumbar
myelogram. Failed L2-L3 approach. L2-L3 was not accessible due to
stenosis.
2. Dextroconvex mild lumbar rotoscoliosis.
3. Multilevel degenerative disc and facet disease.
4. Retrolisthesis of L1 on L2 and L 2 on L3. Essentially no range of
motion on flexion and extension views.

CT LUMBAR MYELOGRAM IMPRESSION:

Severe multilevel lumbar degenerative disc and facet disease. The
central stenosis is most severe at L1-L2 and L2-L3. There is
multilevel foraminal stenosis and less severe stenosis at more
inferior levels. L5-S1 LEFT laminotomy with decompression of the
central canal.

## 2017-06-24 IMAGING — CT CT L SPINE W/ CM
3 of 11 series · 9 of 33 positions shown, 11 images · non-contrast
Comparison: None

CLINICAL DATA: Previous L5-S1 decompression.  Lumbar radiculopathy.
TECHNIQUE: Contiguous axial images were obtained through the Lumbar spine after
the intrathecal infusion of infusion. Coronal and sagittal
reconstructions were obtained of the axial image sets.

[Series 3: l spine soft · axial · 0.30mm/px · z∈[-374,-116]mm · 3 of 87 slices shown, 4 images]
[im 1/87  soft-tissue]
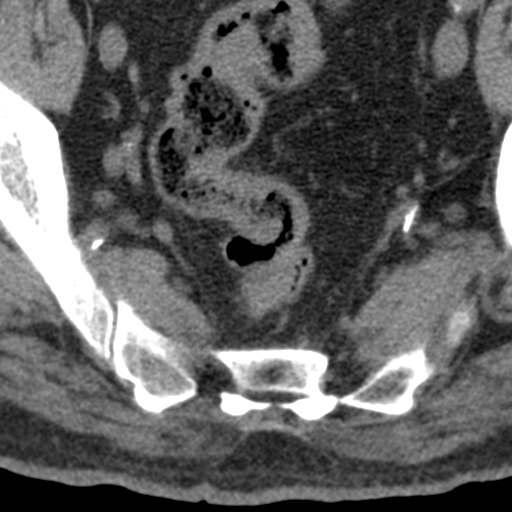
[im 1/87  bone]
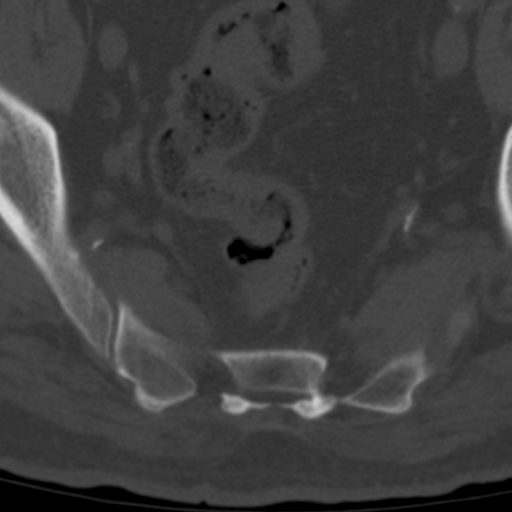
[im 44/87  bone]
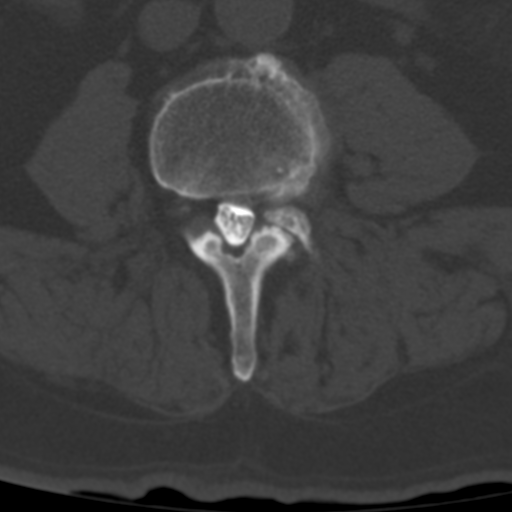
[im 87/87  bone]
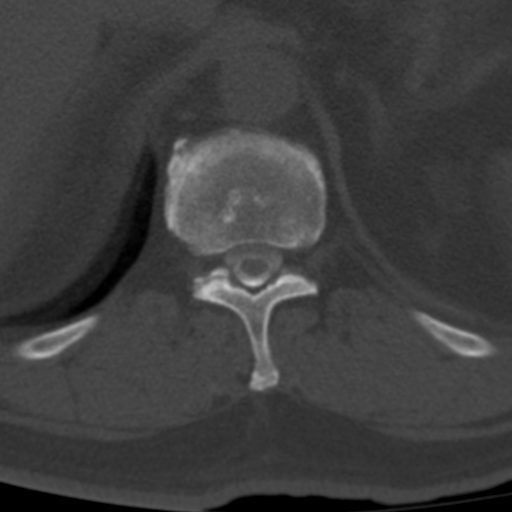

[Series 8: bone cor lower · coronal · 0.29mm/px · 1 of 28 slices shown]
[im 14/28  bone]
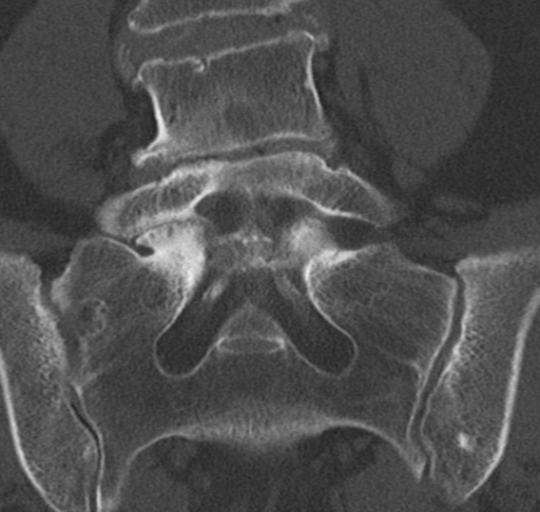

[Series 9: sag bone · sagittal · 0.31mm/px · 5 of 43 slices shown, 6 images]
[im 15/43  bone]
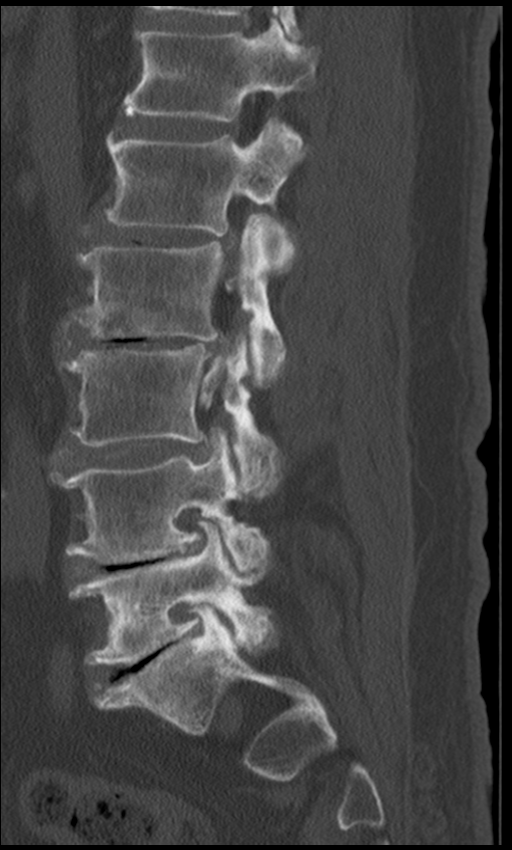
[im 18/43  bone]
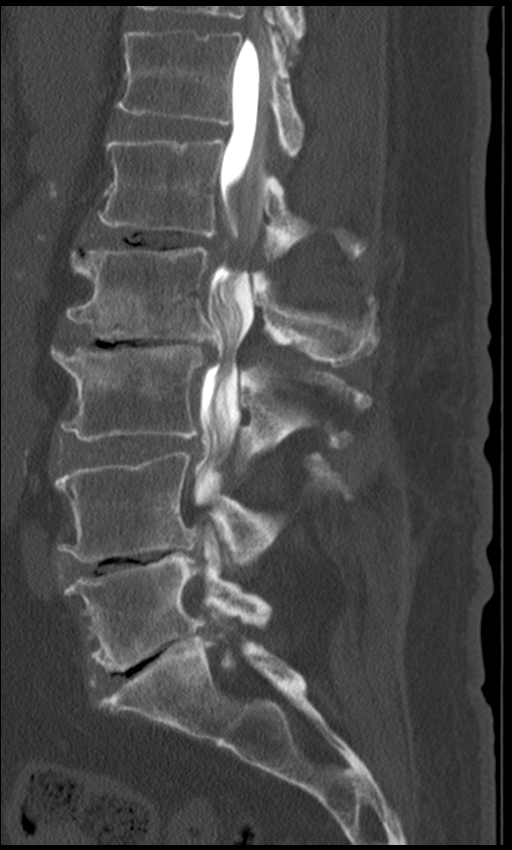
[im 22/43  soft-tissue]
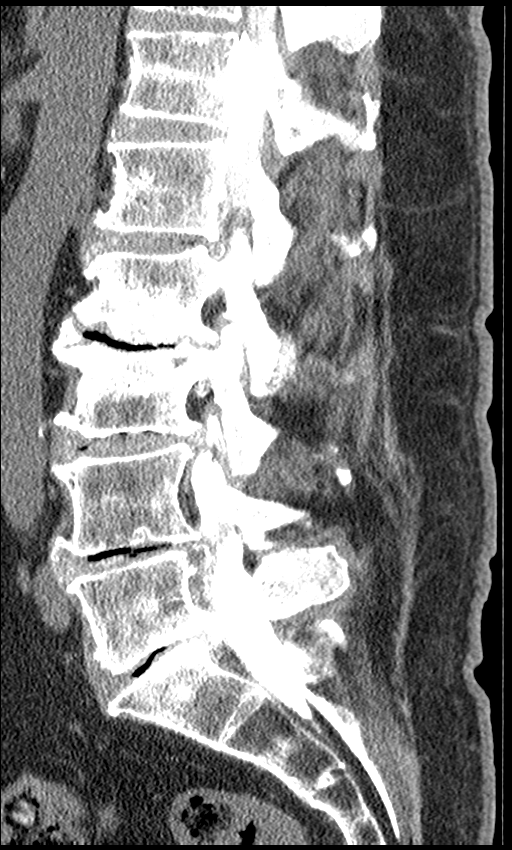
[im 22/43  bone]
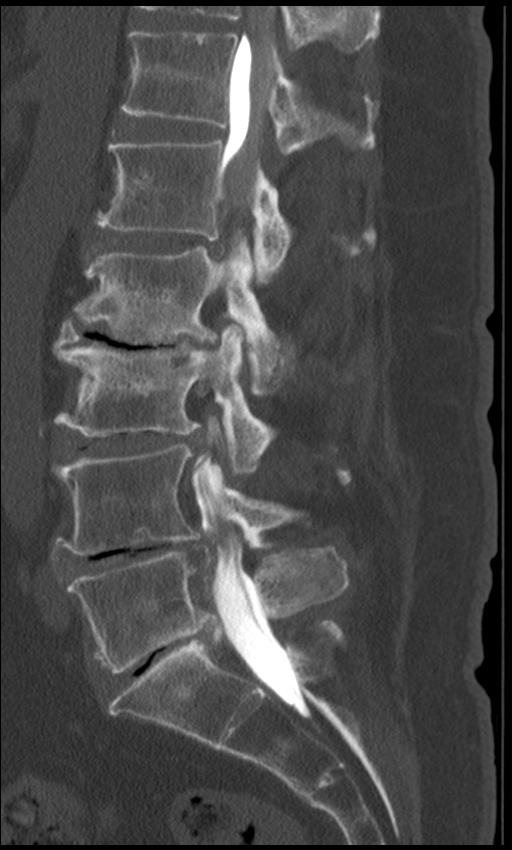
[im 25/43  bone]
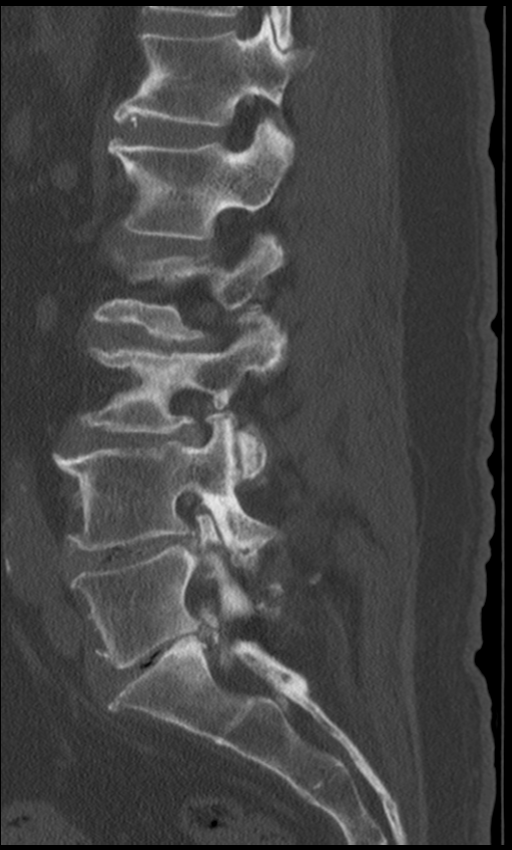
[im 29/43  bone]
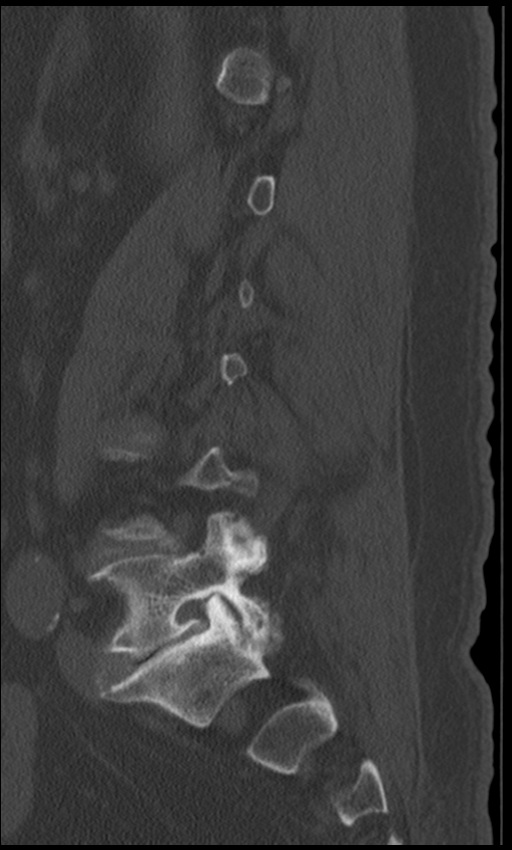

[9 of 33 positions shown; findings below may reference images not displayed]

EXAM:
LUMBAR MYELOGRAM

FLUOROSCOPY TIME:  1 minutes 6 seconds

Fifteen fluoroscopic spot films were obtained.

PROCEDURE:
After thorough discussion of risks and benefits of the procedure
including bleeding, infection, injury to nerves, blood vessels,
adjacent structures as well as headache and CSF leak, written and
oral informed consent was obtained. Consent was obtained by Dr.
Marquel Fray. Time out form was completed.

Patient was positioned prone on the fluoroscopy table. Local
anesthesia was provided with 1% lidocaine without epinephrine after
prepped and draped in the usual sterile fashion. Puncture was
performed at L3-L4 using a 3 1/2 inch 22-gauge spinal needle via
RIGHT paramedian (L2-L3 was initially attempted however access was
unable to be obtained). Approach. Using a single pass through the
dura, the needle was placed within the thecal sac, with return of
clear CSF. 15 mL of Cmnipaque-ON2 was injected into the thecal sac,
with normal opacification of the nerve roots and cauda equina
consistent with free flow within the subarachnoid space.

I personally performed the lumbar puncture and administered the
intrathecal contrast. I also personally supervised acquisition of
the myelogram images.
FINDINGS: LUMBAR MYELOGRAM FINDINGS:

Standing upright frontal projections show a dextroconvex scoliosis
with the apex at L2-L3. Rotatory component of scoliosis. Asymmetric
disc collapse on the LEFT at L2-L3 and L3-L4. Mild leftward slip of
L1 on L2 measuring 7 mm.

On the lateral projections, there is loss of the normal lumbar
lordosis. Grade I retrolisthesis of L3 on L4 measures 6 mm. There is
no pathologic motion with flexion and extension however there is
also no range of motion on the flexion and extension views. The
spinous position the same and flexion, neutral and extension. L1-L2
and L2-L3 moderate central stenosis is present. Vacuum disc present
at L1-L2, L2-L3 and probably at L3-L4. Bilateral lateral recess
stenosis is present at L1-L2 and L2-L3. Lateral recess stenosis is
also present at L3-L4. L4-L5 transverse narrowing of the thecal sac
with vacuum disc.

CT LUMBAR MYELOGRAM FINDINGS:

Segmentation: 5 lumbar type vertebral bodies.

Alignment: Rotoscoliosis again noted. Apex is at L2-L3.
Retrolisthesis of L 2 on L3 measures about [address] on CT. Trace
retrolisthesis of L1 on L2. Grade I retrolisthesis of L5 on S1.

Vertebrae: No aggressive osseous lesions. Severe degenerative
endplate changes. Negative for compression fracture.

Conus medullaris: Mixed injection is present with subdural contrast
dorsal the T12 and L1. Intrathecal opacification is normal. Conus
medullaris is at L1-L2.

Paraspinal tissues: RIGHT common iliac artery ectasia measuring 19
mm. Aortoiliofemoral atherosclerosis.

Disc levels:

T11-T12: Schmorl's nodes. No stenosis. Bilateral facet arthrosis
without resulting stenosis.

T12-L1: Disc degeneration. Facet joints appear normal. No stenosis.
Anterior annular calcification.

L1-L2: Moderate multifactorial central stenosis. The disc is
severely degenerated. There is a broad-based central disc
protrusion. Posterior ligamentum flavum redundancy is also present.
Bilateral subarticular stenosis. Mild symmetric bilateral foraminal
stenosis associated with loss of disc height and trace
retrolisthesis.

L2-L3: Severe central stenosis. Near effacement of the subarachnoid
space. This is due to a combination of retrolisthesis, endplate
osteophytes and broad-based disc bulging. Posterior ligamentum
flavum redundancy exacerbates the central stenosis. Bilateral
subarticular stenosis is present, greater on the LEFT when compared
to the RIGHT. There is moderate LEFT foraminal stenosis due to
endplate spurring and facet spurring. Mild RIGHT foraminal stenosis
due to disc bulging.

L3-L4: Moderate multifactorial central stenosis. Degenerated disc
with vacuum disc. Annular calcification. Broad-based posterior
annular bulging. Bilateral facet arthrosis and hypertrophy. Mild
LEFT foraminal stenosis associated with endplate and facet spurring.
RIGHT foramen appears adequately patent.

L4-L5: Severe disc degeneration. Severe facet arthrosis with over
growth. Moderate to severe central stenosis is present. This is
multifactorial. There is a central disc extrusion which is partially
calcified. Posterior ligamentum flavum redundancy eccentric to the
RIGHT. RIGHT-greater-than-LEFT bilateral subarticular stenosis.
Moderate to severe RIGHT and moderate LEFT foraminal stenosis.

L5-S1: LEFT laminotomy. Central canal is decompressed.
RIGHT-greater-than- LEFT subarticular stenosis associated with
broad-based posterior calcified disc protrusion. Bilateral foraminal
stenosis is moderate to severe. Facet spurring, endplate spurring
and retrolisthesis all contributes to the bilateral foraminal
stenosis. Severe bilateral facet arthrosis.
IMPRESSION: LUMBAR MYELOGRAM IMPRESSION:

1. Technically successful L3-L4 lumbar puncture for lumbar
myelogram. Failed L2-L3 approach. L2-L3 was not accessible due to
stenosis.
2. Dextroconvex mild lumbar rotoscoliosis.
3. Multilevel degenerative disc and facet disease.
4. Retrolisthesis of L1 on L2 and L 2 on L3. Essentially no range of
motion on flexion and extension views.

CT LUMBAR MYELOGRAM IMPRESSION:

Severe multilevel lumbar degenerative disc and facet disease. The
central stenosis is most severe at L1-L2 and L2-L3. There is
multilevel foraminal stenosis and less severe stenosis at more
inferior levels. L5-S1 LEFT laminotomy with decompression of the
central canal.

## 2021-05-25 DIAGNOSIS — I639 Cerebral infarction, unspecified: Secondary | ICD-10-CM

## 2021-05-25 HISTORY — DX: Cerebral infarction, unspecified: I63.9

## 2021-06-07 ENCOUNTER — Inpatient Hospital Stay (HOSPITAL_COMMUNITY): Payer: Medicare FFS

## 2021-06-07 ENCOUNTER — Encounter (HOSPITAL_COMMUNITY): Payer: Self-pay | Admitting: Emergency Medicine

## 2021-06-07 ENCOUNTER — Inpatient Hospital Stay (HOSPITAL_COMMUNITY)
Admission: EM | Admit: 2021-06-07 | Discharge: 2021-06-09 | DRG: 064 | Disposition: A | Discharge status: Home / Self Care | Payer: Medicare FFS | Attending: Internal Medicine | Admitting: Internal Medicine

## 2021-06-07 ENCOUNTER — Emergency Department (HOSPITAL_COMMUNITY): Payer: Medicare FFS

## 2021-06-07 ENCOUNTER — Other Ambulatory Visit: Payer: Self-pay

## 2021-06-07 DIAGNOSIS — E1122 Type 2 diabetes mellitus with diabetic chronic kidney disease: Secondary | ICD-10-CM | POA: Diagnosis present

## 2021-06-07 DIAGNOSIS — I639 Cerebral infarction, unspecified: Secondary | ICD-10-CM | POA: Diagnosis present

## 2021-06-07 DIAGNOSIS — R279 Unspecified lack of coordination: Secondary | ICD-10-CM | POA: Diagnosis present

## 2021-06-07 DIAGNOSIS — Z981 Arthrodesis status: Secondary | ICD-10-CM

## 2021-06-07 DIAGNOSIS — F17210 Nicotine dependence, cigarettes, uncomplicated: Secondary | ICD-10-CM | POA: Diagnosis present

## 2021-06-07 DIAGNOSIS — E538 Deficiency of other specified B group vitamins: Secondary | ICD-10-CM | POA: Diagnosis present

## 2021-06-07 DIAGNOSIS — Z7902 Long term (current) use of antithrombotics/antiplatelets: Secondary | ICD-10-CM

## 2021-06-07 DIAGNOSIS — K219 Gastro-esophageal reflux disease without esophagitis: Secondary | ICD-10-CM | POA: Diagnosis present

## 2021-06-07 DIAGNOSIS — Z79899 Other long term (current) drug therapy: Secondary | ICD-10-CM

## 2021-06-07 DIAGNOSIS — N179 Acute kidney failure, unspecified: Secondary | ICD-10-CM | POA: Diagnosis present

## 2021-06-07 DIAGNOSIS — R482 Apraxia: Secondary | ICD-10-CM | POA: Diagnosis present

## 2021-06-07 DIAGNOSIS — Z794 Long term (current) use of insulin: Secondary | ICD-10-CM | POA: Diagnosis not present

## 2021-06-07 DIAGNOSIS — E1169 Type 2 diabetes mellitus with other specified complication: Secondary | ICD-10-CM

## 2021-06-07 DIAGNOSIS — E785 Hyperlipidemia, unspecified: Secondary | ICD-10-CM | POA: Diagnosis present

## 2021-06-07 DIAGNOSIS — E1142 Type 2 diabetes mellitus with diabetic polyneuropathy: Secondary | ICD-10-CM | POA: Diagnosis present

## 2021-06-07 DIAGNOSIS — Z20822 Contact with and (suspected) exposure to covid-19: Secondary | ICD-10-CM | POA: Diagnosis present

## 2021-06-07 DIAGNOSIS — Z28310 Unvaccinated for covid-19: Secondary | ICD-10-CM | POA: Diagnosis not present

## 2021-06-07 DIAGNOSIS — J449 Chronic obstructive pulmonary disease, unspecified: Secondary | ICD-10-CM | POA: Diagnosis present

## 2021-06-07 DIAGNOSIS — N1832 Chronic kidney disease, stage 3b: Secondary | ICD-10-CM | POA: Diagnosis present

## 2021-06-07 DIAGNOSIS — G9340 Encephalopathy, unspecified: Secondary | ICD-10-CM

## 2021-06-07 DIAGNOSIS — I63519 Cerebral infarction due to unspecified occlusion or stenosis of unspecified middle cerebral artery: Secondary | ICD-10-CM | POA: Diagnosis present

## 2021-06-07 DIAGNOSIS — I129 Hypertensive chronic kidney disease with stage 1 through stage 4 chronic kidney disease, or unspecified chronic kidney disease: Secondary | ICD-10-CM | POA: Diagnosis present

## 2021-06-07 DIAGNOSIS — G936 Cerebral edema: Secondary | ICD-10-CM | POA: Diagnosis present

## 2021-06-07 DIAGNOSIS — I10 Essential (primary) hypertension: Secondary | ICD-10-CM

## 2021-06-07 DIAGNOSIS — Z7982 Long term (current) use of aspirin: Secondary | ICD-10-CM | POA: Diagnosis not present

## 2021-06-07 DIAGNOSIS — I6389 Other cerebral infarction: Secondary | ICD-10-CM | POA: Diagnosis not present

## 2021-06-07 LAB — CBC WITH DIFFERENTIAL/PLATELET
Abs Immature Granulocytes: 0.03 10*3/uL (ref 0.00–0.07)
Basophils Absolute: 0 10*3/uL (ref 0.0–0.1)
Basophils Relative: 0 %
Eosinophils Absolute: 0.2 10*3/uL (ref 0.0–0.5)
Eosinophils Relative: 2 %
HCT: 36 % — ABNORMAL LOW (ref 39.0–52.0)
Hemoglobin: 12.1 g/dL — ABNORMAL LOW (ref 13.0–17.0)
Immature Granulocytes: 0 %
Lymphocytes Relative: 25 %
Lymphs Abs: 1.8 10*3/uL (ref 0.7–4.0)
MCH: 32 pg (ref 26.0–34.0)
MCHC: 33.6 g/dL (ref 30.0–36.0)
MCV: 95.2 fL (ref 80.0–100.0)
Monocytes Absolute: 0.6 10*3/uL (ref 0.1–1.0)
Monocytes Relative: 9 %
Neutro Abs: 4.5 10*3/uL (ref 1.7–7.7)
Neutrophils Relative %: 64 %
Platelets: 190 10*3/uL (ref 150–400)
RBC: 3.78 MIL/uL — ABNORMAL LOW (ref 4.22–5.81)
RDW: 12.9 % (ref 11.5–15.5)
WBC: 7.1 10*3/uL (ref 4.0–10.5)
nRBC: 0 % (ref 0.0–0.2)

## 2021-06-07 LAB — TROPONIN I (HIGH SENSITIVITY)
Troponin I (High Sensitivity): 34 ng/L — ABNORMAL HIGH (ref <18)
Troponin I (High Sensitivity): 33 ng/L — ABNORMAL HIGH (ref <18)

## 2021-06-07 LAB — URINALYSIS, ROUTINE W REFLEX MICROSCOPIC
Bacteria, UA: NONE SEEN
Bilirubin Urine: NEGATIVE
Glucose, UA: 50 mg/dL — AB
Hgb urine dipstick: NEGATIVE
Ketones, ur: NEGATIVE mg/dL
Leukocytes,Ua: NEGATIVE
Nitrite: NEGATIVE
Protein, ur: 30 mg/dL — AB
Specific Gravity, Urine: 1.016 (ref 1.005–1.030)
pH: 5 (ref 5.0–8.0)

## 2021-06-07 LAB — COMPREHENSIVE METABOLIC PANEL
ALT: 16 U/L (ref 0–44)
AST: 20 U/L (ref 15–41)
Albumin: 3.8 g/dL (ref 3.5–5.0)
Alkaline Phosphatase: 62 U/L (ref 38–126)
Anion gap: 11 (ref 5–15)
BUN: 37 mg/dL — ABNORMAL HIGH (ref 8–23)
CO2: 22 mmol/L (ref 22–32)
Calcium: 9.5 mg/dL (ref 8.9–10.3)
Chloride: 103 mmol/L (ref 98–111)
Creatinine, Ser: 2.22 mg/dL — ABNORMAL HIGH (ref 0.61–1.24)
GFR, Estimated: 31 mL/min — ABNORMAL LOW (ref >60)
Glucose, Bld: 191 mg/dL — ABNORMAL HIGH (ref 70–99)
Potassium: 4.1 mmol/L (ref 3.5–5.1)
Sodium: 136 mmol/L (ref 135–145)
Total Bilirubin: 0.5 mg/dL (ref 0.3–1.2)
Total Protein: 7 g/dL (ref 6.5–8.1)

## 2021-06-07 LAB — RAPID URINE DRUG SCREEN, HOSP PERFORMED
Amphetamines: NOT DETECTED
Barbiturates: NOT DETECTED
Benzodiazepines: NOT DETECTED
Cocaine: NOT DETECTED
Opiates: NOT DETECTED
Tetrahydrocannabinol: NOT DETECTED

## 2021-06-07 LAB — TSH
TSH: 1.976 u[IU]/mL (ref 0.350–4.500)
TSH: 1.584 u[IU]/mL (ref 0.350–4.500)

## 2021-06-07 LAB — RESP PANEL BY RT-PCR (FLU A&B, COVID) ARPGX2
Influenza A by PCR: NEGATIVE
Influenza B by PCR: NEGATIVE
SARS Coronavirus 2 by RT PCR: NEGATIVE

## 2021-06-07 LAB — MAGNESIUM: Magnesium: 2.2 mg/dL (ref 1.7–2.4)

## 2021-06-07 LAB — HEMOGLOBIN A1C
Hgb A1c MFr Bld: 7.2 % — ABNORMAL HIGH (ref 4.8–5.6)
Mean Plasma Glucose: 159.94 mg/dL

## 2021-06-07 LAB — PROTIME-INR
INR: 1 (ref 0.8–1.2)
Prothrombin Time: 13.2 seconds (ref 11.4–15.2)

## 2021-06-07 LAB — ETHANOL: Alcohol, Ethyl (B): <10 mg/dL (ref <10)

## 2021-06-07 LAB — CBG MONITORING, ED
Glucose-Capillary: 118 mg/dL — ABNORMAL HIGH (ref 70–99)
Glucose-Capillary: 207 mg/dL — ABNORMAL HIGH (ref 70–99)

## 2021-06-07 LAB — HIV ANTIBODY (ROUTINE TESTING W REFLEX): HIV Screen 4th Generation wRfx: NONREACTIVE

## 2021-06-07 LAB — AMMONIA: Ammonia: 17 umol/L (ref 9–35)

## 2021-06-07 LAB — VITAMIN B12: Vitamin B-12: 257 pg/mL (ref 180–914)

## 2021-06-07 LAB — PHOSPHORUS: Phosphorus: 3.8 mg/dL (ref 2.5–4.6)

## 2021-06-07 MED ORDER — ACETAMINOPHEN 325 MG PO TABS: 650.0000 mg | ORAL_TABLET | Freq: Four times a day (QID) | ORAL | Status: DC | PRN

## 2021-06-07 MED ORDER — INSULIN ASPART 100 UNIT/ML IJ SOLN
0.0000 [IU] | Freq: Three times a day (TID) | INTRAMUSCULAR | Status: DC
  Administered 2021-06-08: 2 [IU] via SUBCUTANEOUS
  Administered 2021-06-08: 3 [IU] via SUBCUTANEOUS
  Administered 2021-06-09: 2 [IU] via SUBCUTANEOUS
  Administered 2021-06-09: 3 [IU] via SUBCUTANEOUS

## 2021-06-07 MED ORDER — VITAMIN D 25 MCG (1000 UNIT) PO TABS
1000.0000 [IU] | ORAL_TABLET | Freq: Every day | ORAL | Status: DC
  Administered 2021-06-07 – 2021-06-09 (×3): 1000 [IU] via ORAL
  Filled 2021-06-07 (×3): qty 1

## 2021-06-07 MED ORDER — ASPIRIN EC 81 MG PO TBEC
81.0000 mg | DELAYED_RELEASE_TABLET | Freq: Every day | ORAL | Status: DC
  Administered 2021-06-07 – 2021-06-08 (×2): 81 mg via ORAL
  Filled 2021-06-07 (×3): qty 1

## 2021-06-07 MED ORDER — ACETAMINOPHEN 650 MG RE SUPP: 650.0000 mg | Freq: Four times a day (QID) | RECTAL | Status: DC | PRN

## 2021-06-07 MED ORDER — STROKE: EARLY STAGES OF RECOVERY BOOK
Freq: Once | Status: DC
  Filled 2021-06-07: qty 1

## 2021-06-07 MED ORDER — ENOXAPARIN SODIUM 40 MG/0.4ML IJ SOSY: 40.0000 mg | PREFILLED_SYRINGE | INTRAMUSCULAR | Status: DC

## 2021-06-07 MED ORDER — NICOTINE 21 MG/24HR TD PT24
21.0000 mg | MEDICATED_PATCH | Freq: Every day | TRANSDERMAL | Status: DC
  Filled 2021-06-07 (×2): qty 1

## 2021-06-07 MED ORDER — ENOXAPARIN SODIUM 30 MG/0.3ML IJ SOSY
30.0000 mg | PREFILLED_SYRINGE | INTRAMUSCULAR | Status: DC
  Administered 2021-06-07: 30 mg via SUBCUTANEOUS
  Filled 2021-06-07: qty 0.3

## 2021-06-07 MED ORDER — INSULIN DETEMIR 100 UNIT/ML ~~LOC~~ SOLN
25.0000 [IU] | Freq: Every day | SUBCUTANEOUS | Status: DC
  Administered 2021-06-07 – 2021-06-08 (×2): 25 [IU] via SUBCUTANEOUS
  Filled 2021-06-07 (×5): qty 0.25

## 2021-06-07 MED ORDER — LACTATED RINGERS IV SOLN: INTRAVENOUS | Status: DC

## 2021-06-07 MED ORDER — CLOPIDOGREL BISULFATE 75 MG PO TABS
75.0000 mg | ORAL_TABLET | Freq: Every day | ORAL | Status: DC
  Administered 2021-06-08 – 2021-06-09 (×2): 75 mg via ORAL
  Filled 2021-06-07 (×2): qty 1

## 2021-06-07 MED ORDER — ONDANSETRON HCL 4 MG PO TABS: 4.0000 mg | ORAL_TABLET | Freq: Four times a day (QID) | ORAL | Status: DC | PRN

## 2021-06-07 MED ORDER — METOPROLOL TARTRATE 25 MG PO TABS
25.0000 mg | ORAL_TABLET | Freq: Two times a day (BID) | ORAL | Status: DC
  Administered 2021-06-07 – 2021-06-09 (×4): 25 mg via ORAL
  Filled 2021-06-07 (×4): qty 1

## 2021-06-07 MED ORDER — ONDANSETRON HCL 4 MG/2ML IJ SOLN: 4.0000 mg | Freq: Four times a day (QID) | INTRAMUSCULAR | Status: DC | PRN

## 2021-06-07 MED ORDER — MAGNESIUM OXIDE -MG SUPPLEMENT 400 (240 MG) MG PO TABS
400.0000 mg | ORAL_TABLET | Freq: Every day | ORAL | Status: DC
  Administered 2021-06-08 – 2021-06-09 (×2): 400 mg via ORAL
  Filled 2021-06-07 (×2): qty 1

## 2021-06-07 MED ORDER — SODIUM CHLORIDE 0.9 % IV BOLUS
1000.0000 mL | Freq: Once | INTRAVENOUS | Status: AC
  Administered 2021-06-07: 1000 mL via INTRAVENOUS

## 2021-06-07 MED ORDER — GEMFIBROZIL 600 MG PO TABS
600.0000 mg | ORAL_TABLET | Freq: Two times a day (BID) | ORAL | Status: DC
  Administered 2021-06-07 – 2021-06-08 (×2): 600 mg via ORAL
  Filled 2021-06-07 (×2): qty 1

## 2021-06-07 MED ORDER — SIMVASTATIN 20 MG PO TABS
10.0000 mg | ORAL_TABLET | Freq: Every day | ORAL | Status: DC
  Administered 2021-06-08 – 2021-06-09 (×2): 10 mg via ORAL
  Filled 2021-06-07 (×2): qty 1

## 2021-06-07 NOTE — Progress Notes (Signed)
Neurology Telephone Recommendations  Contacted by Dr. Alona Bene [facility] ED about this patient who presented with subacute apraxia and encephalopathy for several days. Head CT shows R parietal hypodensity c/f subacute ischemic infarct. No unilateral weakness or numbness on exam per EDP.  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 No indication for repeat noncon head CT given >24 hrs since sx onset. I personally reviewed the head CT, hypodensity is most c/w subacute R parietal ischemic infarct. Given the appearance of cytotoxic edema, presence of encephalopathy, and somewhat unclear duration of sx I did recommend CT head w contrast to r/o mass lesion in that region but patient's creatinine is currently 2.18 so he will need some gentle hydration before this is considered. 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

## 2021-06-07 NOTE — ED Triage Notes (Signed)
Pt states he was seen by his pcp this morning and was sent here for rule out of "mini stroke" pt states hes been having weakness all over for 2-3 weeks. Pt also c/o confusion for approx 1 week.

## 2021-06-07 NOTE — H&P (Signed)
History and Physical    Jon Velazquez YBO:[id number] DOB: 1951-06-25 DOA: 06/07/2021  PCP: Toma Deiters, MD   Patient coming from: home  I have personally briefly reviewed patient's old medical records in [facility]  Chief Complaint: apraxia, decrease coordination and AMS.  HPI: Jon Velazquez is a 70 y.o. male with medical history significant of HTN, type 2 diabetes with nephropathy, HLD, GERD, COPD and tobacco abuse; who presented to ED with changes in his mentation, apraxia and lack of coordination with fine motor skills; symptoms has been present for the last 3 days and more accentuated on the day of admission. Patient's son took patient to his PCP and given the concerns for stroke he was referred to ED for further evaluation and management. Patient denies, CP, SOB, nausea, vomiting, dysuria, hematuria, melena, hematochezia and any other complaints.  COVID PCR negative in ED; patient not vaccinated against COVID  ED Course: blood work demonstrating slight increase in his renal function from baseline (but with big gap on comparison value, > 1-2 year), no signs of infection on UA and CT head with concerns for parietal cortical stroke vs mass and cytotic edema. Neurology recommended admission for stroke work up. Unable to do MRI due to back stimulator/hardware.   Review of Systems: As per HPI otherwise all other systems reviewed and are negative.   Past Medical History:  Diagnosis Date   COPD (chronic obstructive pulmonary disease) (HCC)    Diabetes mellitus without complication (HCC)    type 2   GERD (gastroesophageal reflux disease)    H/O fracture of ankle    Hyperlipidemia    Hypertension     Past Surgical History:  Procedure Laterality Date   BACK SURGERY  mid 1980s   L5-S1, Dr[place]ann[place]   COLONOSCOPY  2012   MULTIPLE TOOTH EXTRACTIONS     POSTERIOR LUMBAR FUSION 4 LEVEL N/A 06/16/2015   Procedure: POSTERIOR LUMBAR INTERBODY FUSION WITH PEDICLE SCREWS  LUMBAR ONE-TWO ,LUMBAR TWO-THREE,LUMBAR THREE-FOUR  ,LUMBAR FOUR-FIVE ;INSERTION BONE GROWTH STIMULATOR ;  Surgeon: Aliene Beams, MD;  Location: [facility];  Service: Neurosurgery;  Laterality: N/A;    Social History  reports that he has been smoking cigarettes. He has a 75.00 pack-year smoking history. He has never used smokeless tobacco. He reports that he does not drink alcohol. No history on file for drug use.  No Known Allergies  Family History  Problem Relation Age of Onset   Lung cancer Father    Cancer Sister     Prior to Admission medications   Medication Sig Start Date End Date Taking? Authorizing Provider  aspirin EC 325 MG tablet Take 325 mg by mouth daily.     [provider]  benazepril (LOTENSIN) 40 MG tablet Take 40 mg by mouth daily.     [provider]  calcium carbonate (OS-CAL) 600 MG TABS tablet Take 600 mg by mouth daily.     [provider]  chlorthalidone (HYGROTON) 25 MG tablet Take 25 mg by mouth daily.     [provider]  Cholecalciferol (VITAMIN D-3) 1000 UNITS CAPS Take 1,000 Units by mouth daily.     [provider]  clopidogrel (PLAVIX) 75 MG tablet  05/06/17   [provider]  gemfibrozil (LOPID) 600 MG tablet Take 600 mg by mouth 2 (two) times daily.     [provider]  LANTUS SOLOSTAR 100 UNIT/ML Solostar Pen Inject 40 Units into the skin daily after breakfast.  05/03/17   [provider]  magnesium oxide (MAG-OX) 400 (241.3 Mg) MG tablet  05/06/17   [provider]  metoprolol tartrate (LOPRESSOR) 25 MG tablet  05/03/17   [provider]  NOVOLOG FLEXPEN 100 UNIT/ML FlexPen Inject 10 Units into the skin.  05/03/17   [provider]  Omega-3 Fatty Acids (FISH OIL) 1000 MG CAPS Take 1,000 mg by mouth daily.     [provider]  simvastatin (ZOCOR) 10 MG tablet Take 10 mg by mouth daily.     [provider]  sitaGLIPtin (JANUVIA) 100 MG  tablet Take 100 mg by mouth daily.     [provider]    Physical Exam: Vitals:   06/07/21 1022 06/07/21 1023  BP: 138/64   Pulse: 62   Resp: 16   Temp: 98.4 F (36.9 C)   TempSrc: Oral   SpO2: 100%   Weight:  83.9 kg  Height:  6' (1.829 m)    Constitutional: NAD, calm, comfortable; passed swallowing evaluation. Vitals:   06/07/21 1022 06/07/21 1023  BP: 138/64   Pulse: 62   Resp: 16   Temp: 98.4 F (36.9 C)   TempSrc: Oral   SpO2: 100%   Weight:  83.9 kg  Height:  6' (1.829 m)   Eyes: PERRL, lids and conjunctivae normal; no icterus, no nystagmus. ENMT: Mucous membranes are moist. Posterior pharynx clear of any exudate or lesions. Neck: normal, supple, no masses, no thyromegaly, no JVD Respiratory: clear to auscultation bilaterally, no wheezing, no crackles. Normal respiratory effort. No accessory muscle use.  Cardiovascular: Regular rate and rhythm, no murmurs / rubs / gallops. No extremity edema. 2+ pedal pulses. No carotid bruits.  Abdomen: no tenderness, no masses palpated. No hepatosplenomegaly. Bowel sounds positive.  Musculoskeletal: no clubbing / cyanosis. No joint deformity upper and lower extremities. Good ROM, no contractures. Normal muscle tone.  Skin: no rashes, no petechiae. Neurologic: CN 2-12 grossly intact. Decrease fine motors and decrease proprioception. Psychiatric: Normal judgment and insight. Alert and oriented x 3. Normal mood.    Labs on Admission: I have personally reviewed following labs and imaging studies  CBC: Recent Labs  Lab 06/07/21 1112  WBC 7.1  NEUTROABS 4.5  HGB 12.1*  HCT 36.0*  MCV 95.2  PLT 190    Basic Metabolic Panel: Recent Labs  Lab 06/07/21 1112  NA 136  K 4.1  CL 103  CO2 22  GLUCOSE 191*  BUN 37*  CREATININE 2.22*  CALCIUM 9.5    GFR: Estimated Creatinine Clearance: 34 mL/min (A) (by C-G formula based on SCr of 2.22 mg/dL (H)).  Liver Function Tests: Recent Labs  Lab 06/07/21 1112  AST  20  ALT 16  ALKPHOS 62  BILITOT 0.5  PROT 7.0  ALBUMIN 3.8    Urine analysis: No results found for: COLORURINE, APPEARANCEUR, LABSPEC, PHURINE, GLUCOSEU, HGBUR, BILIRUBINUR, KETONESUR, PROTEINUR, UROBILINOGEN, NITRITE, LEUKOCYTESUR  Radiological Exams on Admission: CT HEAD WO CONTRAST ( )  Result Date: 06/07/2021 CLINICAL DATA:  Mental status change, unknown cause. EXAM: CT HEAD WITHOUT CONTRAST TECHNIQUE: Contiguous axial images were obtained from the base of the skull through the vertex without intravenous contrast. COMPARISON:  None. FINDINGS: Brain: Large area of low-density involving the right parietal cortex. Pattern is suggestive for cytotoxic edema. Negative for acute hemorrhage, midline shift, mass lesion or hydrocephalus. Ventricles are prominent but likely secondary to atrophy. Low-density in the periventricular white matter is suggestive for chronic changes. Vascular: No hyperdense vessel or unexpected  calcification. Skull: Normal. Negative for fracture or focal lesion. Sinuses/Orbits: Small amount of fluid in the right mastoid air cells. Visualized paranasal sinuses are aerated. Other: None IMPRESSION: 1. Low-density in the right parietal cortex is suggestive for cytotoxic edema. Findings are suggestive for an age-indeterminate infarct. No evidence for acute hemorrhage. 2. Cerebral atrophy and evidence for chronic small vessel ischemic changes. Electronically Signed   By: Richarda Overlie M.D.   On: 06/07/2021 11:40   DG Chest Portable 1 View  Result Date: 06/07/2021 CLINICAL DATA:  Altered mental status EXAM: PORTABLE CHEST 1 VIEW COMPARISON:  None. FINDINGS: Heart and mediastinal contours are within normal limits. No focal opacities or effusions. No acute bony abnormality. IMPRESSION: No active disease. Electronically Signed   By: Charlett Nose M.D.   On: 06/07/2021 11:13    EKG: Independently reviewed. No acute ischemic changes; non specific T waves changes. Sinus rhythm    Assessment/Plan 1-stroke: right parietal area -unable to pursuit MRI -given edema findings, concerns for underlying mass; neurology recommending CT scan with contrast. -will check lipid panel, A1C, TSH and B12 -checking 2-D echo and carotid dopplers. -allowing permissive HTN, involving PT,OT and SPL -patient passed swallowing test -secondary prevention with ASA and plavix -neurology service consulted.  2-HTN -continue metoprolol -rest of med on hold to allow permissive HTN initially  3-HLD -continue statins -follow lipid panel  4-tobacco abuse -cessation counseling provided -will use nicotine patch  5-type 2 diabetes -holding oral hypoglycemic agents -started on SSI and levemir -follow A1C results  6-acute on chronic renal failure -base on his GFR patient with CKD stage 3b at baseline  -will minimize the use of nephrotoxic agents, avoid hypotension and provide IVF's -follow renal function trend   DVT prophylaxis: lovenox Code Status:   Full code Family Communication:  Son at bedside Disposition Plan:   Patient is from:  home  Anticipated DC to:  To b determined  Anticipated DC date:  In 2 days  Anticipated DC barriers: Completion of stroke work up and neurology consultation.  Consults called:  Dr. Gerilyn Pilgrim Admission status:  Inpatient, LOS > 2 midnights, telemetry bed   Severity of Illness: The appropriate patient status for this patient is INPATIENT. Inpatient status is judged to be reasonable and necessary in order to provide the required intensity of service to ensure the patient's safety. The patient's presenting symptoms, physical exam findings, and initial radiographic and laboratory data in the context of their chronic comorbidities is felt to place them at high risk for further clinical deterioration. Furthermore, it is not anticipated that the patient will be medically stable for discharge from the hospital within 2 midnights of admission. The following  factors support the patient status of inpatient.   " The patient's presenting symptoms include AMS, apraxia and decrease coordination.. " The worrisome physical exam findings include decrease proprioception and decrease in fine motor skills.. " The initial radiographic and laboratory data are worrisome because of CT head with cytotic edema and right parietal stroke.. " The chronic co-morbidities include HTN, HLD, tobacco abuse, Type 2 diabetes..   * I certify that at the point of admission it is my clinical judgment that the patient will require inpatient hospital care spanning beyond 2 midnights from the point of admission due to high intensity of service, high risk for further deterioration and high frequency of surveillance required.Vassie Loll MD [facility]  How to contact the [facility] Attending or Consulting provider 7A - 7P or covering provider  during after hours 7P -7A, for this patient?   Check the care team in [facility] and look for a) attending/consulting [facility] provider listed and b) the [facility] team listed Log into [email or web address] and use [place] universal password to access. If you do not have the password, please contact the hospital operator. Locate the [facility] provider you are looking for under [facility] and page to a number that you can be directly reached. If you still have difficulty reaching the provider, please page the [facility] (Director on Call) for the Hospitalists listed on amion for assistance.  06/07/2021, 2:48 PM

## 2021-06-07 NOTE — ED Provider Notes (Signed)
Emergency Department Provider Note   I have reviewed the triage vital signs and the nursing notes.   HISTORY  Chief Complaint Weakness   HPI Jon Velazquez is a 70 y.o. male with past medical history reviewed below presents to the emergency department with sudden altered mental status.  Symptoms have been especially bad over the past several days but the son, who is at bedside, has noticed symptoms over the past week.  He gives description of the patient having difficulty with fine motor tasks such as using scissors.  He has seemed confused at times with trying to put on his pants.  The patient tells me he feels that everybody is overreacting.  He states that his room is dark and it is hard for him to move around and there is an excuse.  He denies any unilateral weakness or numbness.  He states he is not having any pain.  The patient's son states that he finally convinced his father to go to the primary care doctor's office today who promptly referred him to the emergency department with concern for possible stroke.  Patient denies any medication changes.  He states he does not drink alcohol or use drugs.  Does smoke cigarettes. No radiation of symptoms or modifying factors.   Past Medical History:  Diagnosis Date   COPD (chronic obstructive pulmonary disease) (HCC)    Diabetes mellitus without complication (HCC)    type 2   GERD (gastroesophageal reflux disease)    H/O fracture of ankle    Hyperlipidemia    Hypertension     Patient Active Problem List   Diagnosis Date Noted   Type 2 diabetes mellitus with hyperlipidemia (HCC)    Primary hypertension    Hyperlipidemia    Stroke (HCC) 06/07/2021   Lumbar stenosis with neurogenic claudication 06/16/2015    Past Surgical History:  Procedure Laterality Date   BACK SURGERY  mid 1980s   L5-S1, Dr[place] Jeann[place]   COLONOSCOPY  2012   MULTIPLE TOOTH EXTRACTIONS     POSTERIOR LUMBAR FUSION 4 LEVEL N/A 06/16/2015   Procedure:  POSTERIOR LUMBAR INTERBODY FUSION WITH PEDICLE SCREWS LUMBAR ONE-TWO ,LUMBAR TWO-THREE,LUMBAR THREE-FOUR  ,LUMBAR FOUR-FIVE ;INSERTION BONE GROWTH STIMULATOR ;  Surgeon: Aliene Beams, MD;  Location: [facility];  Service: Neurosurgery;  Laterality: N/A;    Allergies Patient has no known allergies.  Family History  Problem Relation Age of Onset   Lung cancer Father    Cancer Sister     Social History Social History   Tobacco Use   Smoking status: Every Day    Packs/day: 1.50    Years: 50.00    Pack years: 75.00    Types: Cigarettes   Smokeless tobacco: Never  Substance Use Topics   Alcohol use: No    Alcohol/week: 0.0 standard drinks    Review of Systems  Constitutional: No fever/chills Eyes: No visual changes. ENT: No sore throat. Cardiovascular: Denies chest pain. Respiratory: Denies shortness of breath. Gastrointestinal: No abdominal pain.  No nausea, no vomiting.  No diarrhea.  No constipation. Genitourinary: Negative for dysuria. Musculoskeletal: Negative for back pain. Skin: Negative for rash. Neurological: Negative for headaches, focal weakness or numbness. Positive intermittent confusion and difficulty with fine motor tasks.   10-point ROS otherwise negative.  ____________________________________________   PHYSICAL EXAM:  VITAL SIGNS: ED Triage Vitals  Enc Vitals Group     BP 06/07/21 1022 138/64     Pulse Rate 06/07/21 1022 62  Resp 06/07/21 1022 16     Temp 06/07/21 1022 98.4 F (36.9 C)     Temp Source 06/07/21 1022 Oral     SpO2 06/07/21 1022 100 %     Weight 06/07/21 1023 184 lb 15.5 oz (83.9 kg)     Height 06/07/21 1023 6' (1.829 m)   Constitutional: Alert and oriented. Well appearing and in no acute distress. Eyes: Conjunctivae are normal. PERRL. EOMI. Head: Atraumatic. Nose: No congestion/rhinnorhea. Mouth/Throat: Mucous membranes are moist.   Neck: No stridor.   Cardiovascular: Normal rate, regular rhythm. Good peripheral  circulation. Grossly normal heart sounds.   Respiratory: Normal respiratory effort.  No retractions. Lungs CTAB. Gastrointestinal: Soft and nontender. No distention.  Musculoskeletal: No lower extremity tenderness nor edema. No gross deformities of extremities. Neurologic:  Normal speech and language. No gross focal neurologic deficits are appreciated. Patient with globally diminished strength but equal bilaterally in the upper/lower extremities. No facial asymmetry.  Skin:  Skin is warm, dry and intact. No rash noted.  ____________________________________________   LABS (all labs ordered are listed, but only abnormal results are displayed)  Labs Reviewed  COMPREHENSIVE METABOLIC PANEL - Abnormal; Notable for the following components:      Result Value   Glucose, Bld 191 (*)    BUN 37 (*)    Creatinine, Ser 2.22 (*)    GFR, Estimated 31 (*)    All other components within normal limits  CBC WITH DIFFERENTIAL/PLATELET - Abnormal; Notable for the following components:   RBC 3.78 (*)    Hemoglobin 12.1 (*)    HCT 36.0 (*)    All other components within normal limits  URINALYSIS, ROUTINE W REFLEX MICROSCOPIC - Abnormal; Notable for the following components:   Glucose, UA 50 (*)    Protein, ur 30 (*)    All other components within normal limits  HEMOGLOBIN A1C - Abnormal; Notable for the following components:   Hgb A1c MFr Bld 7.2 (*)    All other components within normal limits  BASIC METABOLIC PANEL - Abnormal; Notable for the following components:   Glucose, Bld 185 (*)    BUN 26 (*)    Creatinine, Ser 1.83 (*)    GFR, Estimated 39 (*)    All other components within normal limits  CBC - Abnormal; Notable for the following components:   RBC 3.79 (*)    Hemoglobin 12.2 (*)    HCT 36.1 (*)    All other components within normal limits  LIPID PANEL - Abnormal; Notable for the following components:   HDL 29 (*)    LDL Cholesterol 112 (*)    All other components within normal  limits  GLUCOSE, CAPILLARY - Abnormal; Notable for the following components:   Glucose-Capillary 151 (*)    All other components within normal limits  GLUCOSE, CAPILLARY - Abnormal; Notable for the following components:   Glucose-Capillary 120 (*)    All other components within normal limits  GLUCOSE, CAPILLARY - Abnormal; Notable for the following components:   Glucose-Capillary 129 (*)    All other components within normal limits  BASIC METABOLIC PANEL - Abnormal; Notable for the following components:   Glucose, Bld 128 (*)    Creatinine, Ser 1.72 (*)    Calcium 8.8 (*)    GFR, Estimated 42 (*)    All other components within normal limits  GLUCOSE, CAPILLARY - Abnormal; Notable for the following components:   Glucose-Capillary 136 (*)    All other components within normal  limits  GLUCOSE, CAPILLARY - Abnormal; Notable for the following components:   Glucose-Capillary 126 (*)    All other components within normal limits  GLUCOSE, CAPILLARY - Abnormal; Notable for the following components:   Glucose-Capillary 181 (*)    All other components within normal limits  CBG MONITORING, ED - Abnormal; Notable for the following components:   Glucose-Capillary 118 (*)    All other components within normal limits  CBG MONITORING, ED - Abnormal; Notable for the following components:   Glucose-Capillary 207 (*)    All other components within normal limits  TROPONIN I (HIGH SENSITIVITY) - Abnormal; Notable for the following components:   Troponin I (High Sensitivity) 34 (*)    All other components within normal limits  TROPONIN I (HIGH SENSITIVITY) - Abnormal; Notable for the following components:   Troponin I (High Sensitivity) 33 (*)    All other components within normal limits  RESP PANEL BY RT-PCR (FLU A&B, COVID) ARPGX2  ETHANOL  PROTIME-INR  TSH  AMMONIA  RAPID URINE DRUG SCREEN, HOSP PERFORMED  HIV ANTIBODY (ROUTINE TESTING W REFLEX)  MAGNESIUM  PHOSPHORUS  TSH  VITAMIN B12    ____________________________________________  EKG   EKG Interpretation  Date/Time:  Wednesday June 07 2021 10:29:21 EDT Ventricular Rate:  62 PR Interval:  170 QRS Duration: 104 QT Interval:  431 QTC Calculation: 438 R Axis:   35 Text Interpretation: Sinus rhythm Abnormal R-wave progression, early transition Borderline repolarization abnormality Baseline wander in lead(s) I Confirmed by Alona Bene [phone]) on 06/07/2021 10:46:11 AM        ____________________________________________  RADIOLOGY  CT HEAD WO CONTRAST ( )  Result Date: 06/07/2021 CLINICAL DATA:  Mental status change, unknown cause. EXAM: CT HEAD WITHOUT CONTRAST TECHNIQUE: Contiguous axial images were obtained from the base of the skull through the vertex without intravenous contrast. COMPARISON:  None. FINDINGS: Brain: Large area of low-density involving the right parietal cortex. Pattern is suggestive for cytotoxic edema. Negative for acute hemorrhage, midline shift, mass lesion or hydrocephalus. Ventricles are prominent but likely secondary to atrophy. Low-density in the periventricular white matter is suggestive for chronic changes. Vascular: No hyperdense vessel or unexpected calcification. Skull: Normal. Negative for fracture or focal lesion. Sinuses/Orbits: Small amount of fluid in the right mastoid air cells. Visualized paranasal sinuses are aerated. Other: None IMPRESSION: 1. Low-density in the right parietal cortex is suggestive for cytotoxic edema. Findings are suggestive for an age-indeterminate infarct. No evidence for acute hemorrhage. 2. Cerebral atrophy and evidence for chronic small vessel ischemic changes. Electronically Signed   By: Richarda Overlie M.D.   On: 06/07/2021 11:40   DG Chest Portable 1 View  Result Date: 06/07/2021 CLINICAL DATA:  Altered mental status EXAM: PORTABLE CHEST 1 VIEW COMPARISON:  None. FINDINGS: Heart and mediastinal contours are within normal limits. No focal opacities or  effusions. No acute bony abnormality. IMPRESSION: No active disease. Electronically Signed   By: Charlett Nose M.D.   On: 06/07/2021 11:13    ____________________________________________   PROCEDURES  Procedure(s) performed:   Procedures  None ____________________________________________   INITIAL IMPRESSION / ASSESSMENT AND PLAN / ED COURSE  Pertinent labs & imaging results that were available during my care of the patient were reviewed by me and considered in my medical decision making (see chart for details).   Patient presents emergency department with intermittent confusion and some difficulty with motor tasks.  Presents here is more of an encephalopathy type picture although is A&O x 4 with me at  bedside.  Unclear if the symptoms have any association with time of day or medication administration.  I do not appreciate any focal neurologic deficits here to strongly suspect stroke although CT imaging of the head does show question of age-indeterminate infarct.  Given his symptoms, I do feel that MRI of the brain is warranted.  No symptoms to strongly suspect infection/encephalitis.   01:50 PM  Patient with spine stimulator, not recalled by patient, but confirmed in chart. Cannot proceed with MRI. Will discuss with Neuro regarding assessment and possible stroke/TIA w/u as an inpatient vs outpatient follow up.   Spoke with Dr. Selina Cooley. Plan for admit for encephalopathy/CVA w/u. Would recommend CT head with contrast if serum Cr able to support this. Will discuss with TR[facility].   Discussed patient's case with TR[facility] to request admission. Patient and family (if present) updated with plan. Care transferred to [facility] service.  I reviewed all nursing notes, vitals, pertinent old records, EKGs, labs, imaging (as available).  ____________________________________________  FINAL CLINICAL IMPRESSION(S) / ED DIAGNOSES  Final diagnoses:  Encephalopathy acute     MEDICATIONS GIVEN DURING THIS  VISIT:  Medications  sodium chloride 0.9 % bolus 1,000 mL (0 mLs Intravenous Stopped 06/07/21 1545)  iohexol (OMNIPAQUE) 350 MG/ML injection 50 mL (50 mLs Intravenous Contrast Given 06/08/21 1515)  cyanocobalamin ((VITAMIN B-12)) injection 1,000 mcg (1,000 mcg Intramuscular Given 06/08/21 2042)  cyanocobalamin ((VITAMIN B-12)) injection 1,000 mcg (1,000 mcg Intramuscular Given 06/09/21 1230)     NEW OUTPATIENT MEDICATIONS STARTED DURING THIS VISIT:  Discharge Medication List as of 06/09/2021  1:44 PM     START taking these medications   Details  nicotine (NICODERM CQ - DOSED IN MG/24 HOURS) 21 mg/24hr patch Place 1 patch (21 mg total) onto the skin daily., Starting Sat 06/10/2021, Normal        Note:  This document was prepared using Dragon voice recognition software and may include unintentional dictation errors.  Alona Bene, MD, [facility] Emergency Medicine    Ioane Bhola, Arlyss Repress, MD 06/11/21 [phone]

## 2021-06-07 NOTE — ED Notes (Signed)
Pt will not keep leads on as well as BP cuff and oxygen prob. Pt encouraged to keep things on so that we can monitor his condition. Pt states he does not want to keep it on. This RN rechecked vitals just now and will go in and reassess per protocol.

## 2021-06-08 ENCOUNTER — Inpatient Hospital Stay (HOSPITAL_COMMUNITY): Payer: Medicare FFS

## 2021-06-08 DIAGNOSIS — I6389 Other cerebral infarction: Secondary | ICD-10-CM

## 2021-06-08 LAB — GLUCOSE, CAPILLARY
Glucose-Capillary: 151 mg/dL — ABNORMAL HIGH (ref 70–99)
Glucose-Capillary: 120 mg/dL — ABNORMAL HIGH (ref 70–99)
Glucose-Capillary: 129 mg/dL — ABNORMAL HIGH (ref 70–99)
Glucose-Capillary: 136 mg/dL — ABNORMAL HIGH (ref 70–99)

## 2021-06-08 LAB — CBC
HCT: 36.1 % — ABNORMAL LOW (ref 39.0–52.0)
Hemoglobin: 12.2 g/dL — ABNORMAL LOW (ref 13.0–17.0)
MCH: 32.2 pg (ref 26.0–34.0)
MCHC: 33.8 g/dL (ref 30.0–36.0)
MCV: 95.3 fL (ref 80.0–100.0)
Platelets: 183 10*3/uL (ref 150–400)
RBC: 3.79 MIL/uL — ABNORMAL LOW (ref 4.22–5.81)
RDW: 12.7 % (ref 11.5–15.5)
WBC: 7 10*3/uL (ref 4.0–10.5)
nRBC: 0 % (ref 0.0–0.2)

## 2021-06-08 LAB — BASIC METABOLIC PANEL
Anion gap: 9 (ref 5–15)
BUN: 26 mg/dL — ABNORMAL HIGH (ref 8–23)
CO2: 22 mmol/L (ref 22–32)
Calcium: 9.4 mg/dL (ref 8.9–10.3)
Chloride: 108 mmol/L (ref 98–111)
Creatinine, Ser: 1.83 mg/dL — ABNORMAL HIGH (ref 0.61–1.24)
GFR, Estimated: 39 mL/min — ABNORMAL LOW (ref >60)
Glucose, Bld: 185 mg/dL — ABNORMAL HIGH (ref 70–99)
Potassium: 3.9 mmol/L (ref 3.5–5.1)
Sodium: 139 mmol/L (ref 135–145)

## 2021-06-08 MED ORDER — FENOFIBRATE 160 MG PO TABS
160.0000 mg | ORAL_TABLET | Freq: Every day | ORAL | Status: DC
  Administered 2021-06-09: 160 mg via ORAL
  Filled 2021-06-08: qty 1

## 2021-06-08 MED ORDER — CYANOCOBALAMIN 1000 MCG/ML IJ SOLN
1000.0000 ug | Freq: Once | INTRAMUSCULAR | Status: AC
  Administered 2021-06-08: 1000 ug via INTRAMUSCULAR
  Filled 2021-06-08: qty 1

## 2021-06-08 MED ORDER — IOHEXOL 350 MG/ML SOLN
50.0000 mL | Freq: Once | INTRAVENOUS | Status: AC | PRN
  Administered 2021-06-08: 50 mL via INTRAVENOUS

## 2021-06-08 MED ORDER — ENOXAPARIN SODIUM 40 MG/0.4ML IJ SOSY
40.0000 mg | PREFILLED_SYRINGE | INTRAMUSCULAR | Status: DC
  Administered 2021-06-08: 40 mg via SUBCUTANEOUS
  Filled 2021-06-08: qty 0.4

## 2021-06-08 NOTE — Progress Notes (Signed)
PROGRESS NOTE    [name]  TKP:[id number] DOB: 04/14/1951 DOA: 06/07/2021 PCP: Toma Deiters, MD    Chief Complaint  Patient presents with   Weakness    Brief admission narrative:  Jon Velazquez is a 70 y.o. male with medical history significant of HTN, type 2 diabetes with nephropathy, HLD, GERD, COPD and tobacco abuse; who presented to ED with changes in his mentation, apraxia and lack of coordination with fine motor skills; symptoms has been present for the last 3 days and more accentuated on the day of admission. Patient's son took patient to his PCP and given the concerns for stroke he was referred to ED for further evaluation and management. Patient denies, CP, SOB, nausea, vomiting, dysuria, hematuria, melena, hematochezia and any other complaints.   COVID PCR negative in ED; patient not vaccinated against COVID   ED Course: blood work demonstrating slight increase in his renal function from baseline (but with big gap on comparison value, > 1-2 year), no signs of infection on UA and CT head with concerns for parietal cortical stroke vs mass and cytotic edema. Neurology recommended admission for stroke work up. Unable to do MRI due to back stimulator/hardware.    Assessment & Plan: 1-right Parietal ischemic Stroke [facility]) -Still reporting some left side decreased fine motor skills; no pronator drift.  No major deficits. -Seen by PT, OT and SPL recommending outpatient physical therapy. -Overall able to perform safely activities of daily living with minimal assistance. -Continue aspirin and Plavix while waiting recommendations by neurology service. -Continue statins and risk factor modifications. -CT head with contrast as recommended pending to be read by radiology; also 2-D echo pending  -TSH, ammonia and B12 within normal limits.  2-essential hypertension -Continue metoprolol -Follow vital signs. -Heart healthy diet has been encouraged.  3-hyperlipidemia -Continue  statins. -Lipid panel has been ordered and pending  4-tobacco abuse -Cessation counseling provided.  Continue the use of nicotine patch.  5-type 2 diabetes mellitus with nephropathy -Continue holding oral hypoglycemic agents -Continue the use of sliding scale insulin and Levemir. -A1c 7.2  6-acute kidney injury on chronic kidney disease stage IIIb -Improved and back to baseline after fluid resuscitation -Continue to follow renal function trend especially after the use of contrast. -Maintain adequate hydration -Continue avoiding nephrotoxic agents.   DVT prophylaxis: Lovenox Code Status: Full code. Family Communication: No family at bedside. Disposition:   Status is: Inpatient  Remains inpatient appropriate because: Pending work-up completion and recommendation by specialist.  (Neurology service).  Dispo: The patient is from: Home              Anticipated d/c is to: Home              Patient currently is not medically stable to d/c.   Difficult to place patient No       Consultants:  Neurology service  Procedures:  -See below for x-ray reports -2D echo: Pending -Carotid Dopplers: 1. Slightly elevated velocities in the right internal carotid artery with moderate plaque burden in the right carotid bulb suggestive of 50-69% stenosis. 2. 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  Antimicrobials:  None   Subjective: No pronator drift, no major motor deficits appreciated during examination.  Patient continues present poor balance and having some decreased fine motor skills especially on his left side.  Overall feeling better; no chest pain, no nausea, no vomiting.   Objective: Vitals:   06/08/21 5170 06/08/21 0155 06/08/21 0174  06/08/21 1455  BP: (!) 162/74 (!) 152/69 (!) 105/57 (!) 150/51  Pulse: 72 64 68 69  Resp: 18 17 16 20   Temp: 98.8 F (37.1 C) 98.2 F (36.8 C) 98.2 F (36.8 C) 98.3 F (36.8 C)  TempSrc:    Oral Oral  SpO2: 98% 100% 98% 96%  Weight:      Height:        Intake/Output Summary (Last 24 hours) at 06/08/2021 1721 Last data filed at 06/08/2021 1325 Gross per 24 hour  Intake 2121.65 ml  Output --  Net 2121.65 ml   Filed Weights   06/07/21 1023  Weight: 83.9 kg    Examination:  General exam: Appears calm and comfortable; reports feeling better; no chest pain, no nausea, no vomiting.  No major neurologic deficits appreciated during examination.  Negative pronator drift. Respiratory system: Clear to auscultation. Respiratory effort normal.  No requiring oxygen supplementation. Cardiovascular system: S1 & S2 heard, RRR. No JVD, murmurs, rubs, gallops or clicks. No pedal edema. Gastrointestinal system: Abdomen is nondistended, soft and nontender. No organomegaly or masses felt. Normal bowel sounds heard. Central nervous system: Alert and oriented. No new focal neurological deficits. Extremities: No cyanosis or clubbing. Skin: No petechiae.   Psychiatry: Judgement and insight appear normal. Mood & affect appropriate.     Data Reviewed: I have personally reviewed following labs and imaging studies  CBC: Recent Labs  Lab 06/07/21 1112 06/08/21 0525  WBC 7.1 7.0  NEUTROABS 4.5  --   HGB 12.1* 12.2*  HCT 36.0* 36.1*  MCV 95.2 95.3  PLT 190 183    Basic Metabolic Panel: Recent Labs  Lab 06/07/21 1112 06/07/21 1458 06/08/21 0525  NA 136  --  139  K 4.1  --  3.9  CL 103  --  108  CO2 22  --  22  GLUCOSE 191*  --  185*  BUN 37*  --  26*  CREATININE 2.22*  --  1.83*  CALCIUM 9.5  --  9.4  MG  --  2.2  --   PHOS  --  3.8  --     GFR: Estimated Creatinine Clearance: 41.2 mL/min (A) (by C-G formula based on SCr of 1.83 mg/dL (H)).  Liver Function Tests: Recent Labs  Lab 06/07/21 1112  AST 20  ALT 16  ALKPHOS 62  BILITOT 0.5  PROT 7.0  ALBUMIN 3.8    CBG: Recent Labs  Lab 06/07/21 1617 06/07/21 2153 06/08/21 0737 06/08/21 1106 06/08/21 1628   GLUCAP 118* 207* 151* 120* 129*    Recent Results (from the past 240 hour(s))  Resp Panel by RT-PCR (Flu A&B, Covid) Nasopharyngeal Swab     Status: None   Collection Time: 06/07/21 11:57 AM   Specimen: Nasopharyngeal Swab; Nasopharyngeal(NP) swabs in vial transport medium  Result Value Ref Range Status   SARS Coronavirus 2 by RT PCR NEGATIVE NEGATIVE Final    Comment: (NOTE) SARS-CoV-2 target nucleic acids are NOT DETECTED.  The SARS-CoV-2 RNA is generally detectable in upper respiratory specimens during the acute phase of infection. The lowest concentration of SARS-CoV-2 viral copies this assay can detect is 138 copies/mL. A negative result does not preclude SARS-Cov-2 infection and should not be used as the sole basis for treatment or other patient management decisions. A negative result may occur with  improper specimen collection/handling, submission of specimen other than nasopharyngeal swab, presence of viral mutation(s) within the areas targeted by this assay, and inadequate number of viral copies(<138 copies/mL).  A negative result must be combined with clinical observations, patient history, and epidemiological information. The expected result is Negative.  Fact Sheet for Patients:  BloggerCourse.com  Fact Sheet for Healthcare Providers:  SeriousBroker.it  This test is no t yet approved or cleared by the [place] FDA and  has been authorized for detection and/or diagnosis of SARS-CoV-2 by FDA under an Emergency Use Authorization (EUA). This EUA will remain  in effect (meaning this test can be used) for the duration of the COVID-19 declaration under Section 564(b)(1) of the Act, 21 U.S.C.section 360bbb-3(b)(1), unless the authorization is terminated  or revoked sooner.       Influenza A by PCR NEGATIVE NEGATIVE Final   Influenza B by PCR NEGATIVE NEGATIVE Final    Comment: (NOTE) The Xpert Xpress  SARS-CoV-2/FLU/RSV plus assay is intended as an aid in the diagnosis of influenza from Nasopharyngeal swab specimens and should not be used as a sole basis for treatment. Nasal washings and aspirates are unacceptable for Xpert Xpress SARS-CoV-2/FLU/RSV testing.  Fact Sheet for Patients: BloggerCourse.com  Fact Sheet for Healthcare Providers: SeriousBroker.it  This test is not yet approved or cleared by the [place] FDA and has been authorized for detection and/or diagnosis of SARS-CoV-2 by FDA under an Emergency Use Authorization (EUA). This EUA will remain in effect (meaning this test can be used) for the duration of the COVID-19 declaration under Section 564(b)(1) of the Act, 21 U.S.C. section 360bbb-3(b)(1), unless the authorization is terminated or revoked.  Performed at [facility], [address] [place], [place] [address]       Radiology Studies: CT HEAD WO CONTRAST ( )  Result Date: 06/07/2021 CLINICAL DATA:  Mental status change, unknown cause. EXAM: CT HEAD WITHOUT CONTRAST TECHNIQUE: Contiguous axial images were obtained from the base of the skull through the vertex without intravenous contrast. COMPARISON:  None. FINDINGS: Brain: Large area of low-density involving the right parietal cortex. Pattern is suggestive for cytotoxic edema. Negative for acute hemorrhage, midline shift, mass lesion or hydrocephalus. Ventricles are prominent but likely secondary to atrophy. Low-density in the periventricular white matter is suggestive for chronic changes. Vascular: No hyperdense vessel or unexpected calcification. Skull: Normal. Negative for fracture or focal lesion. Sinuses/Orbits: Small amount of fluid in the right mastoid air cells. Visualized paranasal sinuses are aerated. Other: None IMPRESSION: 1. Low-density in the right parietal cortex is suggestive for cytotoxic edema. Findings are suggestive for an age-indeterminate  infarct. No evidence for acute hemorrhage. 2. Cerebral atrophy and evidence for chronic small vessel ischemic changes. Electronically Signed   By: Richarda Overlie M.D.   On: 06/07/2021 11:40   US Carotid Bilateral (at [facility] and [facility] only)  Result Date: 06/07/2021 CLINICAL DATA:  Weakness times 2-3 weeks, altered mental status EXAM: BILATERAL CAROTID DUPLEX ULTRASOUND TECHNIQUE: Wallace Cullens scale imaging, color Doppler and duplex ultrasound were performed of bilateral carotid and vertebral arteries in the neck. COMPARISON:  None. FINDINGS: Criteria: Quantification of carotid stenosis is based on velocity parameters that correlate the residual internal carotid diameter with NASCET-based stenosis levels, using the diameter of the distal internal carotid lumen as the denominator for stenosis measurement. The following velocity measurements were obtained: RIGHT ICA: 150/41 cm/sec CCA: 97/19 cm/sec SYSTOLIC ICA/CCA RATIO:  1.6 ECA: 126 cm/sec LEFT ICA: Occluded CCA: 87/6 cm/sec SYSTOLIC ICA/CCA RATIO:  N/a ECA: 308 cm/sec RIGHT CAROTID ARTERY: Moderate plaque in the carotid bulb. RIGHT VERTEBRAL ARTERY:  Antegrade flow LEFT CAROTID ARTERY:  The left internal carotid artery is occluded.  LEFT VERTEBRAL ARTERY:  Antegrade flow IMPRESSION: 1. Slightly elevated velocities in the right internal carotid artery with moderate plaque burden in the right carotid bulb suggestive of 50-69% stenosis. 2. 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 Electronically Signed   By: Olive Bass M.D.   On: 06/07/2021 16:13   DG Chest Portable 1 View  Result Date: 06/07/2021 CLINICAL DATA:  Altered mental status EXAM: PORTABLE CHEST 1 VIEW COMPARISON:  None. FINDINGS: Heart and mediastinal contours are within normal limits. No focal opacities or effusions. No acute bony abnormality. IMPRESSION: No active disease. Electronically Signed   By: Charlett Nose M.D.   On: 06/07/2021 11:13     Scheduled Meds:    stroke: mapping our early stages of recovery book   Does not apply Once   aspirin EC  81 mg Oral Daily   cholecalciferol  1,000 Units Oral Daily   clopidogrel  75 mg Oral Daily   enoxaparin (LOVENOX) injection  40 mg Subcutaneous Q24H   [START ON 06/09/2021] fenofibrate  160 mg Oral Daily   insulin aspart  0-15 Units Subcutaneous TID WC   insulin detemir  25 Units Subcutaneous QHS   magnesium oxide  400 mg Oral Daily   metoprolol tartrate  25 mg Oral BID   nicotine  21 mg Transdermal Daily   simvastatin  10 mg Oral Daily   Continuous Infusions:  lactated ringers 125 mL/hr at 06/08/21 0349     LOS: 1 day    Time spent: 35 minutes    Vassie Loll, MD [facility]   To contact the attending provider between 7A-7P or the covering provider during after hours 7P-7A, please log into the web site [email or web address] and access using universal [place] password for that web site. If you do not have the password, please call the hospital operator.  06/08/2021, 5:21 PM

## 2021-06-08 NOTE — Evaluation (Signed)
Occupational Therapy Evaluation Patient Details Name: Jon Velazquez MRN: [id number] DOB: [birth year] Today's Date: 06/08/2021   History of Present Illness HPI: Selwyn Reason is a 70 y.o. male with medical history significant of HTN, type 2 diabetes with nephropathy, HLD, GERD, COPD and tobacco abuse; who presented to ED with changes in his mentation, apraxia and lack of coordination with fine motor skills; symptoms has been present for the last 3 days and more accentuated on the day of admission. Patient's son took patient to his PCP and given the concerns for stroke he was referred to ED for further evaluation and management. Patient denies, CP, SOB, nausea, vomiting, dysuria, hematuria, melena, hematochezia and any other complaints.   Clinical Impression   Pt agreeable to OT evaluation. Pt appears to be at or near baseline level for mobility and ADL skills with the exception of visual deficits. Pt presents with significant L visual field cut as seen by clock assessment where the pt filled out all numbers to R of midline. Pt demonstrates good B UE strength and is able to complete bed mobility with Mod I level of assist and transfers within room with SPV to Min G assist. Pt leaning on walls during mobility which he reported is done at baseline. Pt would benefit from an outpatient vision evaluation with a neuro vision specialist. Pt reports that he drives. This therapist recommended not continuing to drive until further vision evaluation. Pt will benefit from continued OT in the hospital and recommended venue below to increase visual skills to improve ADL safety.      Recommendations for follow up therapy are one component of a multi-disciplinary discharge planning process, led by the attending physician.  Recommendations may be updated based on patient status, additional functional criteria and insurance authorization.   Follow Up Recommendations  Supervision - Intermittent;Other (comment) (Outpatient  neuro vision specialist due to L visual field cut.)    Equipment Recommendations  None recommended by OT    Recommendations for Other Services Other (comment) (Neuro vision specialist evaluation.)     Precautions / Restrictions Precautions Precautions: Fall Restrictions Weight Bearing Restrictions: No      Mobility Bed Mobility Overal bed mobility: Modified Independent             General bed mobility comments: mild labored movement    Transfers Overall transfer level: Needs assistance   Transfers: Sit to/from Stand;Stand Pivot Transfers Sit to Stand: Supervision Stand pivot transfers: Supervision;Min guard       General transfer comment: slow labored movement; leaning on walls    Balance Overall balance assessment: Needs assistance Sitting-balance support: No upper extremity supported;Feet supported Sitting balance-Leahy Scale: Good Sitting balance - Comments: seated at EOB   Standing balance support: Single extremity supported;During functional activity Standing balance-Leahy Scale: Fair Standing balance comment: pt leaning on walls during transfers                           ADL either performed or assessed with clinical judgement   ADL Overall ADL's : Needs assistance/impaired     Grooming: Wash/dry hands;Supervision/safety;Min guard;Standing Grooming Details (indicate cue type and reason): Pt able to wash hands standing at sink with SPV to Min G assist.             Lower Body Dressing: Modified independent;Sitting/lateral leans Lower Body Dressing Details (indicate cue type and reason): doffing and donning shoes Toilet Transfer: Supervision/safety;Min guard;Ambulation Toilet Transfer Details (indicate cue type and reason):  Pt leaning on walls; reports he does this at baseline.                 Vision Baseline Vision/History: 1 Wears glasses (reading glasses; previously had cataracts surgery) Ability to See in Adequate Light: 2  Moderately impaired Patient Visual Report: Peripheral vision impairment Vision Assessment?: Yes Eye Alignment: Within Functional Limits Tracking/Visual Pursuits: Able to track stimulus in all quads without difficulty Convergence: Impaired (comment) (R eye drifted laterally after brief convergence) Visual Fields: Left visual field deficit Additional Comments: Pt demosntrates L visual field cut as seen by clock assessment where pt dew numbers completely to the R side of the circle.                Pertinent Vitals/Pain Pain Assessment: No/denies pain     Hand Dominance Right   Extremity/Trunk Assessment Upper Extremity Assessment Upper Extremity Assessment: Overall WFL for tasks assessed   Lower Extremity Assessment Lower Extremity Assessment: Defer to PT evaluation   Cervical / Trunk Assessment Cervical / Trunk Assessment: Normal   Communication Communication Communication: No difficulties   Cognition Arousal/Alertness: Awake/alert Behavior During Therapy: WFL for tasks assessed/performed Overall Cognitive Status: Within Functional Limits for tasks assessed                                                Home Living Family/patient expects to be discharged to:: Private residence Living Arrangements: Children;Other (Comment) (Son and son's girlfriend) Available Help at Discharge: Family;Available 24 hours/day Type of Home: House Home Access: Stairs to enter [employer] of Steps: 2 to 3 Entrance Stairs-Rails: Right Home Layout: One level;Other (Comment) (1 step from kitches to bathroom)     Bathroom Shower/Tub: Producer, television/film/video: Standard (Pt reports he did build a platform that the toilet since on to raise it.) Bathroom Accessibility: Yes How Accessible: Accessible via walker Home Equipment: Walker - 2 wheels;Walker - standard;Bedside commode          Prior Functioning/Environment Level of Independence: Independent  with assistive device(s) (Pt reported that he would use cane or RW PRN mostly for household mobility. Pt reports he leans on walls and furniture when ambulating within the home. Pt reported independence for ADL's and IADL's.)        Comments: community distances with [facility]        OT Problem List: Impaired vision/perception;Impaired balance (sitting and/or standing)      OT Treatment/Interventions: Self-care/ADL training;Therapeutic exercise;Therapeutic activities;Visual/perceptual remediation/compensation;Balance training;Patient/family education    OT Goals(Current goals can be found in the care plan section) Acute Rehab OT Goals Patient Stated Goal: return home OT Goal Formulation: With patient Time For Goal Achievement: 06/22/21 Potential to Achieve Goals: Good  OT Frequency: Min 1X/week    End of Session    Activity Tolerance: Patient tolerated treatment well Patient left: in bed;with call bell/phone within reach  OT Visit Diagnosis: Unsteadiness on feet (R26.81);Apraxia (R48.2)                Time: 3007-6226 OT Time Calculation (min): 23 min Charges:  OT General Charges $OT Visit: 1 Visit OT Evaluation $OT Eval Low Complexity: 1 Low  Jonisha Kindig OT, MOT  Danie Chandler 06/08/2021, 9:52 AM

## 2021-06-08 NOTE — Progress Notes (Signed)
SLP Cancellation Note  Patient Details Name: Jon Velazquez MRN: [id number] DOB: October 02, 1950   Cancelled treatment:       Reason Eval/Treat Not Completed: Patient at procedure or test/unavailable (Pt is off floor for testing. OT noted visual changes.). SLP will check back for SLE as schedule permits.  Thank you,  Havery Moros, CCC-SLP [phone]    Jon Velazquez 06/08/2021, 3:25 PM

## 2021-06-08 NOTE — Consult Note (Signed)
[facility] Briannah Lona A. Gerilyn Pilgrim, MD     [email or web address]          Jon Velazquez is an 70 y.o. male.   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 Risk factors includes age, hypertension, diabetes and dyslipidemia. 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  CTA could be done but given the elevated creatinine, this will be helpful now.The patient should continue on dual antiplatelet agents. As being 325 and Plavix 75 mg is recommended for 3 months.  Also continue with the statin medication. As usual risk factor modifications such as blood pressure and blood sugar control is warranted. A 30 day event monitor is recommended however. Vitamin B12 deficiency which has been replaced. Suggest outpatient replacement additionally. Diabetic polyneuropathy      The patient presents with the subacute onset of confusion and altered mentation. The workup has been significant for CT finding showing evidence of the subacute/ acute infarct involving the right parietal region. The patient states that he has been compliant with all medications. Medication list indicate that he has been on Plavix and the aspirin. Is unclear why he is on dual antiplatelet agents but he tells me he has been compliant with this. The patient seems restless and the wanted to go home. He is still a little confused and cannot provide adequate history as to why he is in the hospital. He does relate that he was told he had a mild stroke. He does not report focal numbness or weakness.  There are no reports of headache or dizziness. He does not report dysarthria or dysphagia. The review systems otherwise unrevealing.   GENERAL:  The patient is thin and somewhat restless. He is trying to get out of bed and trying to remove his IV and leads.  HEENT:  Neck is supple no trauma noted  ABDOMEN: soft  EXTREMITIES: No edema   BACK: normal  SKIN: Normal by  inspection.    MENTAL STATUS: He is awake and alert. Knows that he is in the hospital. He is oriented to the year and the month. He states his age correctly. No dysarthria or aphasia is noted.  CRANIAL NERVES: Pupils are equal, round and reactive to light and accomodation; extra ocular movements are full, there is no significant nystagmus; visual fields are full -  but has extension on double simultaneous stimulation visually on the left side; upper and lower facial muscles are normal in strength and symmetric, there is no flattening of the nasolabial folds; tongue is midline; uvula is midline; shoulder elevation is normal.  MOTOR: Normal tone, bulk and strength; no pronator drift.  There is no of upper lower extremity drift.  COORDINATION: Left finger to nose is normal, right finger to nose is normal, No rest tremor; no intention tremor; no postural tremor; no bradykinesia.  REFLEXES: Deep tendon reflexes are symmetrical and normal but diminished in the lower extremities required augmentation.  SENSATION: Normal to light touch, temperature, and pain. The patient extinguishes consistently on the left side both upper lower extremities to double some a stable stimulation.    NIH Stroke scale 2   Blood pressure (!) 150/51, pulse 69, temperature 98.3 F (36.8 C), temperature source Oral, resp. rate 20, height 6' (1.829 m), weight 83.9 kg, SpO2 96 %.  Past Medical History:  Diagnosis Date   COPD (chronic obstructive pulmonary disease) (HCC)    Diabetes mellitus without complication (HCC)    type 2   GERD (  gastroesophageal reflux disease)    H/O fracture of ankle    Hyperlipidemia    Hypertension     Past Surgical History:  Procedure Laterality Date   BACK SURGERY  mid 1980s   L5-S1, Dr. Jeannetta Nap, [place]   COLONOSCOPY  2012   MULTIPLE TOOTH EXTRACTIONS     POSTERIOR LUMBAR FUSION 4 LEVEL N/A 06/16/2015   Procedure: POSTERIOR LUMBAR INTERBODY FUSION WITH PEDICLE SCREWS LUMBAR  ONE-TWO ,LUMBAR TWO-THREE,LUMBAR THREE-FOUR  ,LUMBAR FOUR-FIVE ;INSERTION BONE GROWTH STIMULATOR ;  Surgeon: Aliene Beams, MD;  Location: [facility];  Service: Neurosurgery;  Laterality: N/A;    Family History  Problem Relation Age of Onset   Lung cancer Father    Cancer Sister     Social History:  reports that he has been smoking cigarettes. He has a 75.00 pack-year smoking history. He has never used smokeless tobacco. He reports that he does not drink alcohol. No history on file for drug use.  Allergies: No Known Allergies  Medications: Prior to Admission medications   Medication Sig Start Date End Date Taking? Authorizing Provider  aspirin EC 325 MG tablet Take 325 mg by mouth daily.    Yes [provider]  benazepril (LOTENSIN) 40 MG tablet Take 20 mg by mouth daily.   Yes [provider]  calcium carbonate (OS-CAL) 600 MG TABS tablet Take 600 mg by mouth daily.    Yes [provider]  Cholecalciferol (VITAMIN D-3) 1000 UNITS CAPS Take 1,000 Units by mouth daily.    Yes [provider]  clopidogrel (PLAVIX) 75 MG tablet Take 75 mg by mouth daily. 05/06/17  Yes [provider]  fenofibrate 160 MG tablet Take 160 mg by mouth daily. 03/22/21  Yes [provider]  LANTUS SOLOSTAR 100 UNIT/ML Solostar Pen Inject 12 Units into the skin daily after breakfast. 05/03/17  Yes [provider]  Omega-3 Fatty Acids (FISH OIL) 1000 MG CAPS Take 1,000 mg by mouth daily.    Yes [provider]  simvastatin (ZOCOR) 10 MG tablet Take 10 mg by mouth daily.    Yes [provider]  sodium bicarbonate 650 MG tablet Take 650 mg by mouth daily. 12/03/19  Yes [provider]  tiZANidine (ZANAFLEX) 4 MG tablet Take 4 mg by mouth at bedtime. 03/22/21  Yes [provider]  chlorthalidone (HYGROTON) 25 MG tablet Take 25 mg by mouth daily.     [provider]  gemfibrozil (LOPID) 600 MG tablet Take 600 mg by  mouth 2 (two) times daily.  Patient not taking: Reported on 06/07/2021    [provider]  magnesium oxide (MAG-OX) 400 (241.3 Mg) MG tablet  05/06/17   [provider]  metoprolol tartrate (LOPRESSOR) 25 MG tablet Take 50 mg by mouth. 05/03/17   [provider]  NOVOLOG FLEXPEN 100 UNIT/ML FlexPen Inject 10 Units into the skin.  Patient not taking: No sig reported 05/03/17   [provider]  sitaGLIPtin (JANUVIA) 100 MG tablet Take 100 mg by mouth daily.  Patient not taking: Reported on 06/07/2021    [provider]    Scheduled Meds:   stroke: mapping our early stages of recovery book   Does not apply Once   aspirin EC  81 mg Oral Daily   cholecalciferol  1,000 Units Oral Daily   clopidogrel  75 mg Oral Daily   enoxaparin (LOVENOX) injection  40 mg Subcutaneous Q24H   [START ON 06/09/2021] fenofibrate  160 mg Oral  Daily   insulin aspart  0-15 Units Subcutaneous TID WC   insulin detemir  25 Units Subcutaneous QHS   magnesium oxide  400 mg Oral Daily   metoprolol tartrate  25 mg Oral BID   nicotine  21 mg Transdermal Daily   simvastatin  10 mg Oral Daily   Continuous Infusions:  lactated ringers 125 mL/hr at 06/08/21 0349   PRN Meds:.acetaminophen **OR** acetaminophen, ondansetron **OR** ondansetron (ZOFRAN) IV     Results for orders placed or performed during the hospital encounter of 06/07/21 (from the past 48 hour(s))  Comprehensive metabolic panel     Status: Abnormal   Collection Time: 06/07/21 11:12 AM  Result Value Ref Range   Sodium 136 135 - 145 mmol/L   Potassium 4.1 3.5 - 5.1 mmol/L   Chloride 103 98 - 111 mmol/L   CO2 22 22 - 32 mmol/L   Glucose, Bld 191 (H) 70 - 99 mg/dL    Comment: Glucose reference range applies only to samples taken after fasting for at least 8 hours.   BUN 37 (H) 8 - 23 mg/dL   Creatinine, Ser 1.66 (H) 0.61 - 1.24 mg/dL   Calcium 9.5 8.9 - 06.3 mg/dL   Total Protein 7.0 6.5 - 8.1 g/dL   Albumin 3.8  3.5 - 5.0 g/dL   AST 20 15 - 41 U/L   ALT 16 0 - 44 U/L   Alkaline Phosphatase 62 38 - 126 U/L   Total Bilirubin 0.5 0.3 - 1.2 mg/dL   GFR, Estimated 31 (L) >60 mL/min    Comment: (NOTE) Calculated using the CKD-EPI Creatinine Equation (2021)    Anion gap 11 5 - 15    Comment: Performed at [facility], [address], [place], [place] [address]  Ethanol     Status: None   Collection Time: 06/07/21 11:12 AM  Result Value Ref Range   Alcohol, Ethyl (B) <10 <10 mg/dL    Comment: (NOTE) Lowest detectable limit for serum alcohol is 10 mg/dL.  For medical purposes only. Performed at [facility], [address], [place], [place] [address]   CBC with Differential     Status: Abnormal   Collection Time: 06/07/21 11:12 AM  Result Value Ref Range   WBC 7.1 4.0 - 10.5 K/uL   RBC 3.78 (L) 4.22 - 5.81 MIL/uL   Hemoglobin 12.1 (L) 13.0 - 17.0 g/dL   HCT 55.7 (L) 32.2 - 02.5 %   MCV 95.2 80.0 - 100.0 fL   MCH 32.0 26.0 - 34.0 pg   MCHC 33.6 30.0 - 36.0 g/dL   RDW 42.7 06.2 - 37.6 %   Platelets 190 150 - 400 K/uL   nRBC 0.0 0.0 - 0.2 %   Neutrophils Relative % 64 %   Neutro Abs 4.5 1.7 - 7.7 K/uL   Lymphocytes Relative 25 %   Lymphs Abs 1.8 0.7 - 4.0 K/uL   Monocytes Relative 9 %   Monocytes Absolute 0.6 0.1 - 1.0 K/uL   Eosinophils Relative 2 %   Eosinophils Absolute 0.2 0.0 - 0.5 K/uL   Basophils Relative 0 %   Basophils Absolute 0.0 0.0 - 0.1 K/uL   Immature Granulocytes 0 %   Abs Immature Granulocytes 0.03 0.00 - 0.07 K/uL    Comment: Performed at [facility], [address] [place], [place] [address]  Troponin I (High Sensitivity)     Status: Abnormal   Collection Time: 06/07/21 11:12 AM  Result Value Ref Range  Troponin I (High Sensitivity) 34 (H) <18 ng/L    Comment: (NOTE) Elevated high sensitivity troponin I (hsTnI) values and significant  changes across serial measurements may suggest ACS but many other  chronic and acute conditions are known to elevate hsTnI results.   Refer to the "Links" section for chest pain algorithms and additional  guidance. Performed at [facility], [address] [place], [place] [address]   Protime-INR     Status: None   Collection Time: 06/07/21 11:12 AM  Result Value Ref Range   Prothrombin Time 13.2 11.4 - 15.2 seconds   INR 1.0 0.8 - 1.2    Comment: (NOTE) INR goal varies based on device and disease states. Performed at [facility], [address], [place], [place] [address]   TSH     Status: None   Collection Time: 06/07/21 11:12 AM  Result Value Ref Range   TSH 1.976 0.350 - 4.500 uIU/mL    Comment: Performed by a 3rd Generation assay with a functional sensitivity of <=0.01 uIU/mL. Performed at [facility], [address] [place], [place] [address]   Ammonia     Status: None   Collection Time: 06/07/21 11:12 AM  Result Value Ref Range   Ammonia 17 9 - 35 umol/L    Comment: Performed at [facility], [address], [place], [place] [address]  Hemoglobin A1c     Status: Abnormal   Collection Time: 06/07/21 11:12 AM  Result Value Ref Range   Hgb A1c MFr Bld 7.2 (H) 4.8 - 5.6 %    Comment: (NOTE) Pre diabetes:          5.7%-6.4%  Diabetes:              >6.4%  Glycemic control for   <7.0% adults with diabetes    Mean Plasma Glucose 159.94 mg/dL    Comment: Performed at [facility], [address] [place], [place] [address]  Resp Panel by RT-PCR (Flu A&B, Covid) Nasopharyngeal Swab     Status: None   Collection Time: 06/07/21 11:57 AM   Specimen: Nasopharyngeal Swab; Nasopharyngeal(NP) swabs in vial transport medium  Result Value Ref Range   SARS Coronavirus 2 by RT PCR NEGATIVE NEGATIVE    Comment: (NOTE) SARS-CoV-2 target nucleic acids are NOT DETECTED.  The SARS-CoV-2 RNA is generally detectable in upper respiratory specimens during the acute phase of infection. The lowest concentration of SARS-CoV-2 viral copies this assay can detect is 138 copies/mL. A negative result does not preclude  SARS-Cov-2 infection and should not be used as the sole basis for treatment or other patient management decisions. A negative result may occur with  improper specimen collection/handling, submission of specimen other than nasopharyngeal swab, presence of viral mutation(s) within the areas targeted by this assay, and inadequate number of viral copies(<138 copies/mL). A negative result must be combined with clinical observations, patient history, and epidemiological information. The expected result is Negative.  Fact Sheet for Patients:  BloggerCourse.com  Fact Sheet for Healthcare Providers:  SeriousBroker.it  This test is no t yet approved or cleared by the [place] FDA and  has been authorized for detection and/or diagnosis of SARS-CoV-2 by FDA under an Emergency Use Authorization (EUA). This EUA will remain  in effect (meaning this test can be used) for the duration of the COVID-19 declaration under Section 564(b)(1) of the Act, 21 U.S.C.section 360bbb-3(b)(1), unless the authorization is terminated  or revoked sooner.       Influenza A  by PCR NEGATIVE NEGATIVE   Influenza B by PCR NEGATIVE NEGATIVE    Comment: (NOTE) The Xpert Xpress SARS-CoV-2/FLU/RSV plus assay is intended as an aid in the diagnosis of influenza from Nasopharyngeal swab specimens and should not be used as a sole basis for treatment. Nasal washings and aspirates are unacceptable for Xpert Xpress SARS-CoV-2/FLU/RSV testing.  Fact Sheet for Patients: BloggerCourse.com  Fact Sheet for Healthcare Providers: SeriousBroker.it  This test is not yet approved or cleared by the [place] FDA and has been authorized for detection and/or diagnosis of SARS-CoV-2 by FDA under an Emergency Use Authorization (EUA). This EUA will remain in effect (meaning this test can be used) for the duration of the COVID-19  declaration under Section 564(b)(1) of the Act, 21 U.S.C. section 360bbb-3(b)(1), unless the authorization is terminated or revoked.  Performed at [facility], [address], [place], [place] [address]   Troponin I (High Sensitivity)     Status: Abnormal   Collection Time: 06/07/21 12:59 PM  Result Value Ref Range   Troponin I (High Sensitivity) 33 (H) <18 ng/L    Comment: (NOTE) Elevated high sensitivity troponin I (hsTnI) values and significant  changes across serial measurements may suggest ACS but many other  chronic and acute conditions are known to elevate hsTnI results.  Refer to the "Links" section for chest pain algorithms and additional  guidance. Performed at [facility], [address], [place], [place] [address]   HIV Antibody (routine testing w rflx)     Status: None   Collection Time: 06/07/21  2:58 PM  Result Value Ref Range   HIV Screen 4th Generation wRfx Non Reactive Non Reactive    Comment: Performed at [facility], [address], [place], [place] [address]  Magnesium     Status: None   Collection Time: 06/07/21  2:58 PM  Result Value Ref Range   Magnesium 2.2 1.7 - 2.4 mg/dL    Comment: Performed at [facility], [address], [place], [place] [address]  Phosphorus     Status: None   Collection Time: 06/07/21  2:58 PM  Result Value Ref Range   Phosphorus 3.8 2.5 - 4.6 mg/dL    Comment: Performed at [facility], [address] [place], [place] [address]  TSH     Status: None   Collection Time: 06/07/21  2:58 PM  Result Value Ref Range   TSH 1.584 0.350 - 4.500 uIU/mL    Comment: Performed by a 3rd Generation assay with a functional sensitivity of <=0.01 uIU/mL. Performed at [facility], [address], [place], [place] [address]   Vitamin B12     Status: None   Collection Time: 06/07/21  2:58 PM  Result Value Ref Range   Vitamin B-12 257 180 - 914 pg/mL    Comment: (NOTE) This assay is not validated for testing neonatal  or myeloproliferative syndrome specimens for Vitamin B12 levels. Performed at [facility], [address] [place], [place] [address]   CBG monitoring, ED     Status: Abnormal   Collection Time: 06/07/21  4:17 PM  Result Value Ref Range   Glucose-Capillary 118 (H) 70 - 99 mg/dL    Comment: Glucose reference range applies only to samples taken after fasting for at least 8 hours.  Urinalysis, Routine w reflex microscopic Urine, Clean Catch     Status: Abnormal   Collection Time: 06/07/21  9:35 PM  Result Value Ref Range   Color, Urine YELLOW YELLOW  APPearance CLEAR CLEAR   Specific Gravity, Urine 1.016 1.005 - 1.030   pH 5.0 5.0 - 8.0   Glucose, UA 50 (A) NEGATIVE mg/dL   Hgb urine dipstick NEGATIVE NEGATIVE   Bilirubin Urine NEGATIVE NEGATIVE   Ketones, ur NEGATIVE NEGATIVE mg/dL   Protein, ur 30 (A) NEGATIVE mg/dL   Nitrite NEGATIVE NEGATIVE   Leukocytes,Ua NEGATIVE NEGATIVE   RBC / HPF 0-5 0 - 5 RBC/hpf   WBC, UA 0-5 0 - 5 WBC/hpf   Bacteria, UA NONE SEEN NONE SEEN    Comment: Performed at [facility], [address] [place], [place] [address]  Urine rapid drug screen (hosp performed)     Status: None   Collection Time: 06/07/21  9:35 PM  Result Value Ref Range   Opiates NONE DETECTED NONE DETECTED   Cocaine NONE DETECTED NONE DETECTED   Benzodiazepines NONE DETECTED NONE DETECTED   Amphetamines NONE DETECTED NONE DETECTED   Tetrahydrocannabinol NONE DETECTED NONE DETECTED   Barbiturates NONE DETECTED NONE DETECTED    Comment: (NOTE) DRUG SCREEN FOR MEDICAL PURPOSES ONLY.  IF CONFIRMATION IS NEEDED FOR ANY PURPOSE, NOTIFY LAB WITHIN 5 DAYS.  LOWEST DETECTABLE LIMITS FOR URINE DRUG SCREEN Drug Class                     Cutoff (ng/mL) Amphetamine and metabolites    1000 Barbiturate and metabolites    200 Benzodiazepine                 200 Tricyclics and metabolites     300 Opiates and metabolites        300 Cocaine and metabolites        300 THC                             50 Performed at [facility], [address], [place], [place] [address]   CBG monitoring, ED     Status: Abnormal   Collection Time: 06/07/21  9:53 PM  Result Value Ref Range   Glucose-Capillary 207 (H) 70 - 99 mg/dL    Comment: Glucose reference range applies only to samples taken after fasting for at least 8 hours.  Basic metabolic panel     Status: Abnormal   Collection Time: 06/08/21  5:25 AM  Result Value Ref Range   Sodium 139 135 - 145 mmol/L   Potassium 3.9 3.5 - 5.1 mmol/L   Chloride 108 98 - 111 mmol/L   CO2 22 22 - 32 mmol/L   Glucose, Bld 185 (H) 70 - 99 mg/dL    Comment: Glucose reference range applies only to samples taken after fasting for at least 8 hours.   BUN 26 (H) 8 - 23 mg/dL   Creatinine, Ser 5.24 (H) 0.61 - 1.24 mg/dL   Calcium 9.4 8.9 - 81.8 mg/dL   GFR, Estimated 39 (L) >60 mL/min    Comment: (NOTE) Calculated using the CKD-EPI Creatinine Equation (2021)    Anion gap 9 5 - 15    Comment: Performed at [facility], [address], [place], [place] [address]  CBC     Status: Abnormal   Collection Time: 06/08/21  5:25 AM  Result Value Ref Range   WBC 7.0 4.0 - 10.5 K/uL   RBC 3.79 (L) 4.22 - 5.81 MIL/uL   Hemoglobin 12.2 (L) 13.0 - 17.0 g/dL   HCT 11.2 (L) 16.2 - 44.6 %   MCV  95.3 80.0 - 100.0 fL   MCH 32.2 26.0 - 34.0 pg   MCHC 33.8 30.0 - 36.0 g/dL   RDW 47.8 29.5 - 62.1 %   Platelets 183 150 - 400 K/uL   nRBC 0.0 0.0 - 0.2 %    Comment: Performed at [facility], [address], [place], [place] [address]  Glucose, capillary     Status: Abnormal   Collection Time: 06/08/21  7:37 AM  Result Value Ref Range   Glucose-Capillary 151 (H) 70 - 99 mg/dL    Comment: Glucose reference range applies only to samples taken after fasting for at least 8 hours.  Glucose, capillary     Status: Abnormal   Collection Time: 06/08/21 11:06 AM  Result Value Ref Range   Glucose-Capillary 120 (H) 70 - 99 mg/dL    Comment: Glucose reference range applies only  to samples taken after fasting for at least 8 hours.  Glucose, capillary     Status: Abnormal   Collection Time: 06/08/21  4:28 PM  Result Value Ref Range   Glucose-Capillary 129 (H) 70 - 99 mg/dL    Comment: Glucose reference range applies only to samples taken after fasting for at least 8 hours.    Studies/Results:    CAROTID DOPPLERS IMPRESSION: 1. Slightly elevated velocities in the right internal carotid artery with moderate plaque burden in the right carotid bulb suggestive of 50-69% stenosis. 2. 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     HEAD CT 06/07/2021 IMPRESSION: 1. Low-density in the right parietal cortex is suggestive for cytotoxic edema. Findings are suggestive for an age-indeterminate infarct. No evidence for acute hemorrhage. 2. Cerebral atrophy and evidence for chronic small vessel ischemic changes.    HEAD CT  06/08/2021 IMPRESSION: No change since yesterday. Acute infarction affecting the right parietal cortical and subcortical brain consistent with right MCA branch vessel infarction. This is acute to subacute. No evidence of hemorrhagic transformation.   Mild chronic small-vessel ischemic change elsewhere affecting the hemispheric white matter.       The head CT scan is reviewed in person and shows hypodense today with cytotoxic edema involving the right parietal region wedge-shaped consistent with the acute infarct over the posterior aspect of the MCA distribution. There is moderate global atrophy. There appears to be diffuse hyperdense signs involving multiple blood vessels suggestive of diffuse atherosclerotic disease.    Ahaana Rochette A. Gerilyn Pilgrim, M.D.  Diplomate, Biomedical engineer of Psychiatry and Neurology ( Neurology). 06/08/2021, 7:12 PM

## 2021-06-08 NOTE — Progress Notes (Signed)
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  CTA could be done but given the elevated creatinine, this will be helpful now.The patient should continue on dual antiplatelet agents. As being 325 and Plavix 75 mg is recommended for 3 months. A 30 day event monitor is recommended however.

## 2021-06-08 NOTE — Plan of Care (Signed)
  Problem: Education: Goal: Knowledge of General Education information will improve Description: Including pain rating scale, medication(s)/side effects and non-pharmacologic comfort measures Outcome: Progressing   Problem: Health Behavior/Discharge Planning: Goal: Ability to manage health-related needs will improve Outcome: Progressing   Problem: Education: Goal: Knowledge of disease or condition will improve Outcome: Progressing Goal: Knowledge of secondary prevention will improve Outcome: Progressing   Problem: Ischemic Stroke/TIA Tissue Perfusion: Goal: Complications of ischemic stroke/TIA will be minimized Outcome: Progressing

## 2021-06-08 NOTE — Plan of Care (Signed)
  Problem: Acute Rehab OT Goals (only OT should resolve) Goal: OT Additional ADL Goal #1 Flowsheets (Taken 06/08/2021 0956) Additional ADL Goal #1: Pt will demonstrate improved awareness of L side visual field during ADL completion by completing ADL's with min cuing to L side.  Davien Malone OT, MOT

## 2021-06-08 NOTE — Evaluation (Signed)
Speech Language Pathology Evaluation Patient Details Name: Jon Velazquez MRN: [id number] DOB: [birth year] Today's Date: 06/08/2021 Time: 2409-7353 SLP Time Calculation (min) (ACUTE ONLY): 27 min  Problem List:  Patient Active Problem List   Diagnosis Date Noted   Stroke (HCC) 06/07/2021   Lumbar stenosis with neurogenic claudication 06/16/2015   Past Medical History:  Past Medical History:  Diagnosis Date   COPD (chronic obstructive pulmonary disease) (HCC)    Diabetes mellitus without complication (HCC)    type 2   GERD (gastroesophageal reflux disease)    H/O fracture of ankle    Hyperlipidemia    Hypertension    Past Surgical History:  Past Surgical History:  Procedure Laterality Date   BACK SURGERY  mid 1980s   L5-S1, Dr. Jeannetta Nap, [facility]   COLONOSCOPY  2012   MULTIPLE TOOTH EXTRACTIONS     POSTERIOR LUMBAR FUSION 4 LEVEL N/A 06/16/2015   Procedure: POSTERIOR LUMBAR INTERBODY FUSION WITH PEDICLE SCREWS LUMBAR ONE-TWO ,LUMBAR TWO-THREE,LUMBAR THREE-FOUR  ,LUMBAR FOUR-FIVE ;INSERTION BONE GROWTH STIMULATOR ;  Surgeon: Aliene Beams, MD;  Location: [facility];  Service: Neurosurgery;  Laterality: N/A;   HPI:  Jon Velazquez is a 70 y.o. male with medical history significant of HTN, type 2 diabetes with nephropathy, HLD, GERD, COPD and tobacco abuse; who presented to ED with changes in his mentation, apraxia and lack of coordination with fine motor skills; symptoms has been present for the last 3 days and more accentuated on the day of admission. Patient's son took patient to his PCP and given the concerns for stroke he was referred to ED for further evaluation and management. Patient denies, CP, SOB, nausea, vomiting, dysuria, hematuria, melena, hematochezia and any other complaints. MRI shows:Low-density in the right parietal cortex is suggestive for  cytotoxic edema. Findings are suggestive for an age-indeterminate  infarct. No evidence for acute hemorrhage. SLE requested.    Assessment / Plan / Recommendation Clinical Impression  Cognitive linguistic evaluation completed at bedside. Pt reports feeling at his baseline in regards to cognitive linguistic skills. He lives with family, but was independent prior to admission (driving, finances, medication managemet, and reading). He indicates that he ha been on disablity for 20 years and spends his time watching television and reading science fiction and fantasy novels. He wears glassess for reading, but does not have them here in the hospital. He was administered the SLUMS and achieved a score of 22/30 with errors in mental calculation, digit repetition in reverse, and clock drawing. In addition, he required frequent repetition of directions due to reduced attention and processing. With repetition, he was accurate in his responses. Recommend HH versus OP SLP therapy if Pt desires to address attention deficits. He may wish to follow up with an opthalmologist if his readers do not help. No further acute SLP services needed at this time. SLP will sign off.    SLP Assessment  SLP Recommendation/Assessment: All further Speech Lanaguage Pathology  needs can be addressed in the next venue of care SLP Visit Diagnosis: Attention and concentration deficit Attention and concentration deficit following: Cerebral infarction    Recommendations for follow up therapy are one component of a multi-disciplinary discharge planning process, led by the attending physician.  Recommendations may be updated based on patient status, additional functional criteria and insurance authorization.    Follow Up Recommendations  Outpatient SLP;Home health SLP (If Pt desires, unsure of his baseline)    Frequency and Duration  SLP Evaluation Cognition  Overall Cognitive Status: No family/caregiver present to determine baseline cognitive functioning Arousal/Alertness: Awake/alert Orientation Level: Oriented X4 Year: 2022 Month:  September Day of Week: Correct Attention: Sustained Sustained Attention: Impaired Sustained Attention Impairment: Verbal complex Memory: Impaired (for immediate) Memory Impairment: Storage deficit Awareness: Impaired Awareness Impairment: Emergent impairment Problem Solving: Impaired Problem Solving Impairment: Verbal complex Executive Function: Self Correcting Self Correcting: Impaired Self Correcting Impairment: Functional complex Safety/Judgment:  (questionable due to visual deficits)       Comprehension  Auditory Comprehension Overall Auditory Comprehension: Impaired Yes/No Questions: Within Functional Limits Commands: Within Functional Limits (with repetition) Conversation: Complex Interfering Components: Attention;Processing speed EffectiveTechniques: Repetition;Extra processing time Visual Recognition/Discrimination Discrimination: Exceptions to [facility] Other Visual Recogniton/Discrimination Comments: question mild left neglect versus visual field changes on the left Reading Comprehension Reading Status: Impaired (Pt enjoys reading sciFi and fantasy, needs readers) Word level: Within functional limits Sentence Level:  (Missed one word on the left side) Paragraph Level: Not tested Interfering Components: Attention;Visual scanning;Left neglect/inattention;Eye glasses not available Effective Techniques: Large print;Visual cueing    Expression Expression Primary Mode of Expression: Verbal Verbal Expression Overall Verbal Expression: Appears within functional limits for tasks assessed Initiation: No impairment Automatic Speech: Name;Social Response Level of Generative/Spontaneous Verbalization: Conversation Repetition: No impairment Naming: No impairment Pragmatics: No impairment Non-Verbal Means of Communication: Not applicable Written Expression Dominant Hand: Right Written Expression: Exceptions to [facility] (off centered, but accurate for name and address)   Oral /  Motor  Oral Motor/Sensory Function Overall Oral Motor/Sensory Function: Within functional limits Motor Speech Overall Motor Speech: Appears within functional limits for tasks assessed Respiration: Within functional limits Phonation: Normal Resonance: Within functional limits Articulation: Within functional limitis Intelligibility: Intelligible Motor Planning: Witnin functional limits Motor Speech Errors: Not applicable   Thank you,  Havery Moros, CCC-SLP [phone]                     Karolyne Timmons 06/08/2021, 4:33 PM

## 2021-06-09 ENCOUNTER — Telehealth: Payer: Self-pay | Admitting: *Deleted

## 2021-06-09 DIAGNOSIS — I639 Cerebral infarction, unspecified: Secondary | ICD-10-CM

## 2021-06-09 DIAGNOSIS — E785 Hyperlipidemia, unspecified: Secondary | ICD-10-CM

## 2021-06-09 DIAGNOSIS — E1169 Type 2 diabetes mellitus with other specified complication: Secondary | ICD-10-CM

## 2021-06-09 DIAGNOSIS — I10 Essential (primary) hypertension: Secondary | ICD-10-CM

## 2021-06-09 LAB — ECHOCARDIOGRAM COMPLETE
AR max vel: 1.94 cm2
AV Area VTI: 1.93 cm2
AV Area mean vel: 1.66 cm2
AV Mean grad: 5 mmHg
AV Peak grad: 10.5 mmHg
Ao pk vel: 1.62 m/s
Area-P 1/2: 3.02 cm2
Height: 72 in
MV VTI: 1.54 cm2
S' Lateral: 3.7 cm
Weight: 2959.46 oz

## 2021-06-09 LAB — LIPID PANEL
Cholesterol: 164 mg/dL (ref 0–200)
HDL: 29 mg/dL — ABNORMAL LOW (ref >40)
LDL Cholesterol: 112 mg/dL — ABNORMAL HIGH (ref 0–99)
Total CHOL/HDL Ratio: 5.7 RATIO
Triglycerides: 113 mg/dL (ref <150)
VLDL: 23 mg/dL (ref 0–40)

## 2021-06-09 LAB — GLUCOSE, CAPILLARY
Glucose-Capillary: 126 mg/dL — ABNORMAL HIGH (ref 70–99)
Glucose-Capillary: 181 mg/dL — ABNORMAL HIGH (ref 70–99)

## 2021-06-09 LAB — BASIC METABOLIC PANEL
Anion gap: 7 (ref 5–15)
BUN: 20 mg/dL (ref 8–23)
CO2: 23 mmol/L (ref 22–32)
Calcium: 8.8 mg/dL — ABNORMAL LOW (ref 8.9–10.3)
Chloride: 106 mmol/L (ref 98–111)
Creatinine, Ser: 1.72 mg/dL — ABNORMAL HIGH (ref 0.61–1.24)
GFR, Estimated: 42 mL/min — ABNORMAL LOW (ref >60)
Glucose, Bld: 128 mg/dL — ABNORMAL HIGH (ref 70–99)
Potassium: 4.3 mmol/L (ref 3.5–5.1)
Sodium: 136 mmol/L (ref 135–145)

## 2021-06-09 MED ORDER — CYANOCOBALAMIN 1000 MCG/ML IJ SOLN
1000.0000 ug | Freq: Once | INTRAMUSCULAR | Status: AC
  Administered 2021-06-09: 1000 ug via INTRAMUSCULAR
  Filled 2021-06-09: qty 1

## 2021-06-09 MED ORDER — ASPIRIN EC 325 MG PO TBEC
325.0000 mg | DELAYED_RELEASE_TABLET | Freq: Every day | ORAL | Status: DC
  Administered 2021-06-09: 325 mg via ORAL
  Filled 2021-06-09: qty 1

## 2021-06-09 MED ORDER — NICOTINE 21 MG/24HR TD PT24: 21.0000 mg | MEDICATED_PATCH | Freq: Every day | TRANSDERMAL | 0 refills | Status: AC

## 2021-06-09 MED ORDER — SIMVASTATIN 20 MG PO TABS: 20.0000 mg | ORAL_TABLET | Freq: Every day | ORAL | 2 refills | Status: DC

## 2021-06-09 NOTE — Progress Notes (Signed)
Nsg Discharge Note  Admit Date:  06/07/2021 Discharge date: 06/09/2021   Jon Velazquez to be D/C'd Home per MD order.  AVS completed.  Patient able to verbalize understanding.  Discharge Medication: Allergies as of 06/09/2021   No Known Allergies      Medication List     STOP taking these medications    gemfibrozil 600 MG tablet Commonly known as: LOPID   NovoLOG FlexPen 100 UNIT/ML FlexPen Generic drug: insulin aspart   sitaGLIPtin 100 MG tablet Commonly known as: JANUVIA       TAKE these medications    aspirin EC 325 MG tablet Take 325 mg by mouth daily.   benazepril 40 MG tablet Commonly known as: LOTENSIN Take 20 mg by mouth daily.   calcium carbonate 600 MG Tabs tablet Commonly known as: OS-CAL Take 600 mg by mouth daily.   chlorthalidone 25 MG tablet Commonly known as: HYGROTON Take 25 mg by mouth daily.   clopidogrel 75 MG tablet Commonly known as: PLAVIX Take 75 mg by mouth daily.   fenofibrate 160 MG tablet Take 160 mg by mouth daily.   Fish Oil 1000 MG Caps Take 1,000 mg by mouth daily.   Lantus SoloStar 100 UNIT/ML Solostar Pen Generic drug: insulin glargine Inject 12 Units into the skin daily after breakfast.   magnesium oxide 400 (241.3 Mg) MG tablet Commonly known as: MAG-OX   metoprolol tartrate 25 MG tablet Commonly known as: LOPRESSOR Take 50 mg by mouth.   nicotine 21 mg/24hr patch Commonly known as: NICODERM CQ - dosed in mg/24 hours Place 1 patch (21 mg total) onto the skin daily. Start taking on: June 10, 2021   simvastatin 20 MG tablet Commonly known as: ZOCOR Take 1 tablet (20 mg total) by mouth daily. What changed:  medication strength how much to take   sodium bicarbonate 650 MG tablet Take 650 mg by mouth daily.   tiZANidine 4 MG tablet Commonly known as: ZANAFLEX Take 4 mg by mouth at bedtime.   Vitamin D-3 25 MCG (1000 UT) Caps Take 1,000 Units by mouth daily.        Discharge  Assessment: Vitals:   06/09/21 0911 06/09/21 1046  BP:  (!) 158/77  Pulse: 73 68  Resp:  20  Temp:  98.4 F (36.9 C)  SpO2:  98%   Skin clean, dry and intact without evidence of skin break down, no evidence of skin tears noted. IV catheter discontinued intact. Site without signs and symptoms of complications - no redness or edema noted at insertion site, patient denies c/o pain - only slight tenderness at site.  Dressing with slight pressure applied.  D/c Instructions-Education: Discharge instructions given to patient/family with verbalized understanding. D/c education completed with patient including follow up instructions, medication list, d/c activities limitations if indicated, with other d/c instructions as indicated by MD - patient able to verbalize understanding, all questions fully answered. Patient instructed to return to ED, call 911, or call MD for any changes in condition.  Patient escorted via WC, and D/C home via private auto.  Jon Vanvleck Salena Saner, RN 06/09/2021 1:51 PM

## 2021-06-09 NOTE — Plan of Care (Signed)
  Problem: Activity: Goal: Risk for activity intolerance will decrease Outcome: Progressing   Problem: Activity: Goal: Risk for activity intolerance will decrease Outcome: Progressing   Problem: Nutrition: Goal: Adequate nutrition will be maintained Outcome: Adequate for Discharge   Problem: Coping: Goal: Level of anxiety will decrease Outcome: Adequate for Discharge   Problem: Elimination: Goal: Will not experience complications related to bowel motility Outcome: Adequate for Discharge   Problem: Pain Managment: Goal: General experience of comfort will improve Outcome: Adequate for Discharge   Problem: Safety: Goal: Ability to remain free from injury will improve Outcome: Adequate for Discharge   Problem: Skin Integrity: Goal: Risk for impaired skin integrity will decrease Outcome: Adequate for Discharge   Problem: Ischemic Stroke/TIA Tissue Perfusion: Goal: Complications of ischemic stroke/TIA will be minimized Outcome: Adequate for Discharge

## 2021-06-09 NOTE — Telephone Encounter (Signed)
Staff msg received from Dr. Gwenlyn Perking stating that pt needs 30 day event monitor for stroke. Order placed and pt enroll in [employer].

## 2021-06-09 NOTE — Evaluation (Signed)
Physical Therapy Evaluation Patient Details Name: Jon Velazquez MRN: [id number] DOB: 1951/03/13 Today's Date: 06/09/2021  History of Present Illness  HPI: Jon Velazquez is a 70 y.o. male with medical history significant of HTN, type 2 diabetes with nephropathy, HLD, GERD, COPD and tobacco abuse; who presented to ED with changes in his mentation, apraxia and lack of coordination with fine motor skills; symptoms has been present for the last 3 days and more accentuated on the day of admission. Patient's son took patient to his PCP and given the concerns for stroke he was referred to ED for further evaluation and management. Patient denies, CP, SOB, nausea, vomiting, dysuria, hematuria, melena, hematochezia and any other complaints.   Clinical Impression  Patient functioning near baseline for functional mobility and gait. Patient seated EOB at beginning of session. He transfers to standing without AD with unsteadiness upon standing and reaches for tray for support. Patient able to ambulate several steps without AD reaching for nearby walls/objects for support. Patient given RW for stability and is able to ambulate with wide BOS and poor foot clearance bilaterally. Patient returned to room at end of session. Patient may benefit from outpatient PT but patient apprehensive to pursue to due financial constraints. Patient discharged to care of nursing for ambulation daily as tolerated for length of stay.        Recommendations for follow up therapy are one component of a multi-disciplinary discharge planning process, led by the attending physician.  Recommendations may be updated based on patient status, additional functional criteria and insurance authorization.  Follow Up Recommendations No PT follow up    Equipment Recommendations  None recommended by PT    Recommendations for Other Services       Precautions / Restrictions Precautions Precautions: Fall Restrictions Weight Bearing Restrictions:  No      Mobility  Bed Mobility               General bed mobility comments: seated EOB    Transfers Overall transfer level: Needs assistance Equipment used: None Transfers: Sit to/from Stand;Stand Pivot Transfers Sit to Stand: Supervision;Min guard Stand pivot transfers: Supervision;Min guard       General transfer comment: slow labored movement; leaning tray  Ambulation/Gait Ambulation/Gait assistance: Supervision Gait Distance (Feet): 100 Feet Assistive device: Rolling walker (2 wheeled);None Gait Pattern/deviations: Wide base of support Gait velocity: decreased   General Gait Details: Able to ambulate without AD with unsteady cadence reaching for support. Patient ambualtes with RW with poor foot clearance and trunk flexed  Stairs            Wheelchair Mobility    Modified Rankin (Stroke Patients Only)       Balance Overall balance assessment: Needs assistance Sitting-balance support: No upper extremity supported;Feet supported Sitting balance-Leahy Scale: Good Sitting balance - Comments: seated at EOB   Standing balance support: Single extremity supported;During functional activity Standing balance-Leahy Scale: Fair Standing balance comment: without AD, good with RW                             Pertinent Vitals/Pain      Home Living Family/patient expects to be discharged to:: Private residence Living Arrangements: Children;Other (Comment) (Son and son's girlfriend) Available Help at Discharge: Family;Available 24 hours/day Type of Home: House Home Access: Stairs to enter Entrance Stairs-Rails: Right Entrance Stairs-Number of Steps: 2 to 3 Home Layout: One level;Other (Comment) (1 step from kitches to bathroom) Home Equipment: Dan Humphreys -  2 wheels;Walker - standard;Bedside commode      Prior Function Level of Independence: Independent with assistive device(s) (Pt reported that he would use cane or RW PRN mostly for household  mobility. Pt reports he leans on walls and furniture when ambulating within the home. Pt reported independence for ADL's and IADL's.)               Hand Dominance   Dominant Hand: Right    Extremity/Trunk Assessment   Upper Extremity Assessment Upper Extremity Assessment: Defer to OT evaluation    Lower Extremity Assessment Lower Extremity Assessment: Overall WFL for tasks assessed;Generalized weakness    Cervical / Trunk Assessment Cervical / Trunk Assessment: Normal  Communication   Communication: No difficulties  Cognition Arousal/Alertness: Awake/alert Behavior During Therapy: WFL for tasks assessed/performed Overall Cognitive Status: Within Functional Limits for tasks assessed                                        General Comments      Exercises     Assessment/Plan    PT Assessment Patent does not need any further PT services  PT Problem List         PT Treatment Interventions      PT Goals (Current goals can be found in the Care Plan section)  Acute Rehab PT Goals Patient Stated Goal: return home PT Goal Formulation: With patient Time For Goal Achievement: 06/09/21 Potential to Achieve Goals: Good    Frequency     Barriers to discharge        Co-evaluation               AM-PAC PT "6 Clicks" Mobility  Outcome Measure Help needed turning from your back to your side while in a flat bed without using bedrails?: None Help needed moving from lying on your back to sitting on the side of a flat bed without using bedrails?: None Help needed moving to and from a bed to a chair (including a wheelchair)?: A Little Help needed standing up from a chair using your arms (e.g., wheelchair or bedside chair)?: A Little Help needed to walk in hospital room?: A Little Help needed climbing 3-5 steps with a railing? : A Lot 6 Click Score: 19    End of Session Equipment Utilized During Treatment: Gait belt Activity Tolerance: Patient  tolerated treatment well Patient left: in bed;with call bell/phone within reach Nurse Communication: Mobility status PT Visit Diagnosis: Unsteadiness on feet (R26.81);Other abnormalities of gait and mobility (R26.89);Muscle weakness (generalized) (M62.81)    Time: 1610-9604 PT Time Calculation (min) (ACUTE ONLY): 11 min   Charges:   PT Evaluation $PT Eval Low Complexity: 1 Low PT Treatments $Therapeutic Activity: 8-22 mins        9:48 AM, 06/09/21 Wyman Songster PT, DPT Physical Therapist at [facility]

## 2021-06-09 NOTE — Discharge Summary (Signed)
Physician Discharge Summary  Jon Velazquez IHK:[id number] DOB: 10-13-1950 DOA: 06/07/2021  PCP: Toma Deiters, MD  Admit date: 06/07/2021 Discharge date: 06/09/2021  Time spent: 35 minutes minutes  Recommendations for Outpatient Follow-up:  Repeat basic metabolic panel to evaluate lites renal function Continue assisting patient with tobacco cessation Make sure patient has follow-up with neurology service as instructed Reassess blood pressure and further adjust antihypertensive as needed   Discharge Diagnoses:  Active Problems:   Stroke [facility])   Type 2 diabetes mellitus with hyperlipidemia (HCC)   Primary hypertension   Hyperlipidemia Tobacco abuse 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  Discharged home with instruction to follow-up with PCP and neurology service.  CODE STATUS: Full code.  Diet recommendation: Modified carbohydrates and heart healthy diet.  Filed Weights   06/07/21 1023  Weight: 83.9 kg    History of present illness:  Jon Velazquez is a 70 y.o. male with medical history significant of HTN, type 2 diabetes with nephropathy, HLD, GERD, COPD and tobacco abuse; who presented to ED with changes in his mentation, apraxia and lack of coordination with fine motor skills; symptoms has been present for the last 3 days and more accentuated on the day of admission. Patient's son took patient to his PCP and given the concerns for stroke he was referred to ED for further evaluation and management. Patient denies, CP, SOB, nausea, vomiting, dysuria, hematuria, melena, hematochezia and any other complaints.   COVID PCR negative in ED; patient not vaccinated against COVID   ED Course: blood work demonstrating slight increase in his renal function from baseline (but with big gap on comparison value, > 1-2 year), no signs of infection on UA and CT head with concerns for parietal cortical stroke vs mass and cytotic edema.  Neurology recommended admission for stroke work up. Unable to do MRI due to back stimulator/hardware.   Hospital Course:  1-right Parietal ischemic Stroke [facility]) -Still reporting some left side decreased fine motor skills; no pronator drift.  No major deficits. -Seen by PT, OT and SPL with recommendations for outpatient physical therapy if desired. -Overall able to perform safely activities of daily living with minimal assistance. -Continue aspirin and Plavix for secondary prevention. -Outpatient follow-up in 4 weeks with neurology service. -Continue statins and risk factor modifications. -CT head with contrast as recommended by neurology demonstrating no masses and stable right parietal infarct. -TSH, ammonia and B12 within normal limits.   2-essential hypertension -Stable overall -Continue home antihypertensive regimen.   3-hyperlipidemia -Continue statins at adjusted dose. -Lipid panel demonstrating LDL of 112 -Patient advised to follow low-fat diet.   4-tobacco abuse -Cessation counseling provided.   -Continue the use of nicotine patch.   5-type 2 diabetes mellitus with nephropathy -Resume home hypoglycemic regimen. -A1c 7.2 -Patient advised to follow modified carbohydrate diet.   6-acute kidney injury on chronic kidney disease stage IIIb -Improved and back to baseline after fluid resuscitation -Recommending repeat basic metabolic panel follow-up visit to reassess renal function and stability and trend. -Continue to maintain adequate hydration -Continue avoiding/minimizing the use of nephrotoxic agents. -Patient advised to follow heart healthy diet.  Procedures: See below for x-ray reports 2-D Echo 1. Left ventricular ejection fraction, by estimation, is 60 to 65%. The  left ventricle has normal function. The left ventricle has no regional  wall motion abnormalities. There is moderate concentric left ventricular  hypertrophy. Left ventricular  diastolic parameters are  indeterminate. Elevated left ventricular  end-diastolic pressure.   2. Right ventricular systolic function is normal. The right ventricular  size is normal. There is normal pulmonary artery systolic pressure. The  estimated right ventricular systolic pressure is 19.8 mmHg.   3. Left atrial size was mildly dilated.   4. Right atrial size was mildly dilated.   5. The mitral valve is degenerative. Mild to moderate mitral valve  regurgitation.   6. The aortic valve is tricuspid. There is mild calcification of the  aortic valve. Aortic valve regurgitation is not visualized. Mild to  moderate aortic valve sclerosis/calcification is present, without any  evidence of aortic stenosis. Aortic valve mean   gradient measures 5.0 mmHg.   7. Aortic dilatation noted. There is mild dilatation of the aortic root,  measuring 42 mm.   8. The inferior vena cava is normal in size with greater than 50%  respiratory variability, suggesting right atrial pressure of 3 mmHg.   Consultations: Neurology   Discharge Exam: Vitals:   06/09/21 0911 06/09/21 1046  BP:  (!) 158/77  Pulse: 73 68  Resp:  20  Temp:  98.4 F (36.9 C)  SpO2:  98%    General: Afebrile, no chest pain, no nausea, no vomiting, feeling ready to go home.  Patient in no acute distress.  Hemodynamically stable Cardiovascular: S1 and S2, no rubs, no gallops, no JVD Respiratory: Clear to auscultation bilaterally Abdomen: Soft, nontender, nontender, positive bowel sounds. Extremities: No cyanosis or clubbing.  Discharge Instructions   Discharge Instructions     Diet - low sodium heart healthy   Complete by: As directed    Diet Carb Modified   Complete by: As directed    Discharge instructions   Complete by: As directed    Take medications as prescribed Maintain adequate hydration  Follow heart healthy and modified Carb diet. Arrange follow up with PCP in 10 days.   Increase activity slowly   Complete by: As directed        Allergies as of 06/09/2021   No Known Allergies      Medication List     STOP taking these medications    gemfibrozil 600 MG tablet Commonly known as: LOPID   NovoLOG FlexPen 100 UNIT/ML FlexPen Generic drug: insulin aspart   sitaGLIPtin 100 MG tablet Commonly known as: JANUVIA       TAKE these medications    aspirin EC 325 MG tablet Take 325 mg by mouth daily.   benazepril 40 MG tablet Commonly known as: LOTENSIN Take 20 mg by mouth daily.   calcium carbonate 600 MG Tabs tablet Commonly known as: OS-CAL Take 600 mg by mouth daily.   chlorthalidone 25 MG tablet Commonly known as: HYGROTON Take 25 mg by mouth daily.   clopidogrel 75 MG tablet Commonly known as: PLAVIX Take 75 mg by mouth daily.   fenofibrate 160 MG tablet Take 160 mg by mouth daily.   Fish Oil 1000 MG Caps Take 1,000 mg by mouth daily.   Lantus SoloStar 100 UNIT/ML Solostar Pen Generic drug: insulin glargine Inject 12 Units into the skin daily after breakfast.   magnesium oxide 400 (241.3 Mg) MG tablet Commonly known as: MAG-OX   metoprolol tartrate 25 MG tablet Commonly known as: LOPRESSOR Take 50 mg by mouth.   nicotine 21 mg/24hr patch Commonly known as: NICODERM CQ - dosed in mg/24 hours Place 1 patch (21 mg total) onto the skin daily. Start taking on: June 10, 2021   simvastatin 20 MG tablet Commonly  known as: ZOCOR Take 1 tablet (20 mg total) by mouth daily. What changed:  medication strength how much to take   sodium bicarbonate 650 MG tablet Take 650 mg by mouth daily.   tiZANidine 4 MG tablet Commonly known as: ZANAFLEX Take 4 mg by mouth at bedtime.   Vitamin D-3 25 MCG (1000 UT) Caps Take 1,000 Units by mouth daily.       No Known Allergies  Follow-up Information     Hasanaj, Myra Gianotti, MD. Schedule an appointment as soon as possible for a visit in 10 day(s).   Specialty: Internal Medicine Contact information: [address] [place]  [address] [phone]         Beryle Beams, MD. Schedule an appointment as soon as possible for a visit in 1 month(s).   Specialty: Neurology Contact information: [address] [place] [address] [phone]                 The results of significant diagnostics from this hospitalization (including imaging, microbiology, ancillary and laboratory) are listed below for reference.    Significant Diagnostic Studies: CT HEAD WO CONTRAST ( )  Result Date: 06/07/2021 CLINICAL DATA:  Mental status change, unknown cause. EXAM: CT HEAD WITHOUT CONTRAST TECHNIQUE: Contiguous axial images were obtained from the base of the skull through the vertex without intravenous contrast. COMPARISON:  None. FINDINGS: Brain: Large area of low-density involving the right parietal cortex. Pattern is suggestive for cytotoxic edema. Negative for acute hemorrhage, midline shift, mass lesion or hydrocephalus. Ventricles are prominent but likely secondary to atrophy. Low-density in the periventricular white matter is suggestive for chronic changes. Vascular: No hyperdense vessel or unexpected calcification. Skull: Normal. Negative for fracture or focal lesion. Sinuses/Orbits: Small amount of fluid in the right mastoid air cells. Visualized paranasal sinuses are aerated. Other: None IMPRESSION: 1. Low-density in the right parietal cortex is suggestive for cytotoxic edema. Findings are suggestive for an age-indeterminate infarct. No evidence for acute hemorrhage. 2. Cerebral atrophy and evidence for chronic small vessel ischemic changes. Electronically Signed   By: Richarda Overlie M.D.   On: 06/07/2021 11:40   CT HEAD W CONTRAST ( )  Result Date: 06/08/2021 CLINICAL DATA:  Neurological deficit, acute, stroke suspected. Abnormal head CT done yesterday. EXAM: CT HEAD WITH CONTRAST TECHNIQUE: Contiguous axial images were obtained from the base of the skull through the vertex with intravenous contrast. CONTRAST:  77mL  OMNIPAQUE IOHEXOL 350 MG/ML SOLN COMPARISON:  06/07/2021. FINDINGS: Brain: No change since yesterday. No focal abnormality seen affecting the brainstem or cerebellum. Left cerebral hemispheres scratch that left cerebral hemisphere shows atrophy with chronic small-vessel ischemic change of the white matter. Right cerebral hemisphere shows atrophy with acute/subacute infarction in the right parietal cortical and subcortical brain. Low-density and swelling without evidence of hemorrhage or significant mass effect. No hydrocephalus or extra-axial collection. Vascular: There is atherosclerotic calcification of the major vessels at the base of the brain. Skull: Negative Sinuses/Orbits: Clear/normal Other: None IMPRESSION: No change since yesterday. Acute infarction affecting the right parietal cortical and subcortical brain consistent with right MCA branch vessel infarction. This is acute to subacute. No evidence of hemorrhagic transformation. Mild chronic small-vessel ischemic change elsewhere affecting the hemispheric white matter. Electronically Signed   By: Paulina Fusi M.D.   On: 06/08/2021 17:28   US Carotid Bilateral (at [facility] and [facility] only)  Result Date: 06/07/2021 CLINICAL DATA:  Weakness times 2-3 weeks, altered mental status EXAM: BILATERAL CAROTID DUPLEX ULTRASOUND TECHNIQUE: Wallace Cullens scale  imaging, color Doppler and duplex ultrasound were performed of bilateral carotid and vertebral arteries in the neck. COMPARISON:  None. FINDINGS: Criteria: Quantification of carotid stenosis is based on velocity parameters that correlate the residual internal carotid diameter with NASCET-based stenosis levels, using the diameter of the distal internal carotid lumen as the denominator for stenosis measurement. The following velocity measurements were obtained: RIGHT ICA: 150/41 cm/sec CCA: 97/19 cm/sec SYSTOLIC ICA/CCA RATIO:  1.6 ECA: 126 cm/sec LEFT ICA: Occluded CCA: 87/6 cm/sec SYSTOLIC ICA/CCA RATIO:  N/a ECA: 308 cm/sec  RIGHT CAROTID ARTERY: Moderate plaque in the carotid bulb. RIGHT VERTEBRAL ARTERY:  Antegrade flow LEFT CAROTID ARTERY:  The left internal carotid artery is occluded. LEFT VERTEBRAL ARTERY:  Antegrade flow IMPRESSION: 1. Slightly elevated velocities in the right internal carotid artery with moderate plaque burden in the right carotid bulb suggestive of 50-69% stenosis. 2. 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 Electronically Signed   By: Olive Bass M.D.   On: 06/07/2021 16:13   DG Chest Portable 1 View  Result Date: 06/07/2021 CLINICAL DATA:  Altered mental status EXAM: PORTABLE CHEST 1 VIEW COMPARISON:  None. FINDINGS: Heart and mediastinal contours are within normal limits. No focal opacities or effusions. No acute bony abnormality. IMPRESSION: No active disease. Electronically Signed   By: Charlett Nose M.D.   On: 06/07/2021 11:13   ECHOCARDIOGRAM COMPLETE  Result Date: 06/09/2021    ECHOCARDIOGRAM REPORT   Patient Name:   Jon Velazquez Date of Exam: 06/08/2021 Medical Rec #:  [id number]     Height:       72.0 in Accession #:    [id number]    Weight:       185.0 lb Date of Birth:  1951-07-15    BSA:          2.061 m Patient Age:    69 years      BP:           105/57 mmHg Patient Gender: M             HR:           66 bpm. [facility] Procedure: 2D Echo, Cardiac Doppler and Color Doppler Indications:    Stroke  History:        Patient has no prior history of Echocardiogram examinations.                 Stroke; Arrythmias:LBBB.  Sonographer:    Mikki Harbor Referring Phys: Vassie Loll IMPRESSIONS  1. Left ventricular ejection fraction, by estimation, is 60 to 65%. The left ventricle has normal function. The left ventricle has no regional wall motion abnormalities. There is moderate concentric left ventricular hypertrophy. Left ventricular diastolic parameters are indeterminate. Elevated left ventricular end-diastolic pressure.  2. Right  ventricular systolic function is normal. The right ventricular size is normal. There is normal pulmonary artery systolic pressure. The estimated right ventricular systolic pressure is 19.8 mmHg.  3. Left atrial size was mildly dilated.  4. Right atrial size was mildly dilated.  5. The mitral valve is degenerative. Mild to moderate mitral valve regurgitation.  6. The aortic valve is tricuspid. There is mild calcification of the aortic valve. Aortic valve regurgitation is not visualized. Mild to moderate aortic valve sclerosis/calcification is present, without any evidence of aortic stenosis. Aortic valve mean  gradient measures 5.0 mmHg.  7. Aortic dilatation noted. There is mild dilatation of the aortic root, measuring 42  mm.  8. The inferior vena cava is normal in size with greater than 50% respiratory variability, suggesting right atrial pressure of 3 mmHg. Comparison(s): No prior Echocardiogram. FINDINGS  Left Ventricle: Left ventricular ejection fraction, by estimation, is 60 to 65%. The left ventricle has normal function. The left ventricle has no regional wall motion abnormalities. The left ventricular internal cavity size was normal in size. There is  moderate concentric left ventricular hypertrophy. Left ventricular diastolic parameters are indeterminate. Elevated left ventricular end-diastolic pressure. Right Ventricle: The right ventricular size is normal. No increase in right ventricular wall thickness. Right ventricular systolic function is normal. There is normal pulmonary artery systolic pressure. The tricuspid regurgitant velocity is 2.05 m/s, and  with an assumed right atrial pressure of 3 mmHg, the estimated right ventricular systolic pressure is 19.8 mmHg. Left Atrium: Left atrial size was mildly dilated. Right Atrium: Right atrial size was mildly dilated. Pericardium: There is no evidence of pericardial effusion. Mitral Valve: The mitral valve is degenerative in appearance. There is mild  thickening of the mitral valve leaflet(s). There is mild calcification of the mitral valve leaflet(s). Mild to moderate mitral valve regurgitation. MV peak gradient, 7.5 mmHg. The mean mitral valve gradient is 3.0 mmHg. Tricuspid Valve: The tricuspid valve is grossly normal. Tricuspid valve regurgitation is trivial. Aortic Valve: The aortic valve is tricuspid. There is mild calcification of the aortic valve. There is mild aortic valve annular calcification. Aortic valve regurgitation is not visualized. Mild to moderate aortic valve sclerosis/calcification is present, without any evidence of aortic stenosis. Aortic valve mean gradient measures 5.0 mmHg. Aortic valve peak gradient measures 10.5 mmHg. Aortic valve area, by VTI measures 1.93 cm. Pulmonic Valve: The pulmonic valve was not well visualized. Pulmonic valve regurgitation is not visualized. Aorta: The aortic root is normal in size and structure and aortic dilatation noted. There is mild dilatation of the aortic root, measuring 42 mm. Venous: The inferior vena cava is normal in size with greater than 50% respiratory variability, suggesting right atrial pressure of 3 mmHg. IAS/Shunts: The interatrial septum appears to be lipomatous. No atrial level shunt detected by color flow Doppler.  LEFT VENTRICLE PLAX 2D LVIDd:         5.60 cm  Diastology LVIDs:         3.70 cm  LV e' medial:    8.16 cm/s LV PW:         1.40 cm  LV E/e' medial:  13.2 LV IVS:        1.50 cm  LV e' lateral:   7.40 cm/s LVOT diam:     2.00 cm  LV E/e' lateral: 14.6 LV SV:         67 LV SV Index:   33 LVOT Area:     3.14 cm  RIGHT VENTRICLE RV Basal diam:  3.95 cm RV Mid diam:    3.10 cm RV S prime:     16.50 cm/s TAPSE (M-mode): 3.7 cm LEFT ATRIUM              Index       RIGHT ATRIUM           Index LA diam:        4.40 cm  2.13 cm/m  RA Area:     22.20 cm LA Vol (A2C):   115.0 ml 55.80 ml/m RA Volume:   73.20 ml  35.52 ml/m LA Vol (A4C):   57.2 ml  27.75 ml/m LA Biplane Vol:  85.5 ml   41.49 ml/m  AORTIC VALVE AV Area (Vmax):    1.94 cm AV Area (Vmean):   1.66 cm AV Area (VTI):     1.93 cm AV Vmax:           162.00 cm/s AV Vmean:          108.000 cm/s AV VTI:            0.349 m AV Peak Grad:      10.5 mmHg AV Mean Grad:      5.0 mmHg LVOT Vmax:         99.90 cm/s LVOT Vmean:        57.200 cm/s LVOT VTI:          0.214 m LVOT/AV VTI ratio: 0.61  AORTA Ao Root diam: 3.80 cm MITRAL VALVE                TRICUSPID VALVE MV Area (PHT): 3.02 cm     TR Peak grad:   16.8 mmHg MV Area VTI:   1.54 cm     TR Vmax:        205.00 cm/s MV Peak grad:  7.5 mmHg MV Mean grad:  3.0 mmHg     SHUNTS MV Vmax:       1.37 m/s     Systemic VTI:  0.21 m MV Vmean:      81.6 cm/s    Systemic Diam: 2.00 cm MV Decel Time: 251 msec MV E velocity: 108.00 cm/s MV A velocity: 103.00 cm/s MV E/A ratio:  1.05 Nona Dell MD Electronically signed by Nona Dell MD Signature Date/Time: 06/09/2021/12:49:41 PM    Final     Microbiology: Recent Results (from the past 240 hour(s))  Resp Panel by RT-PCR (Flu A&B, Covid) Nasopharyngeal Swab     Status: None   Collection Time: 06/07/21 11:57 AM   Specimen: Nasopharyngeal Swab; Nasopharyngeal(NP) swabs in vial transport medium  Result Value Ref Range Status   SARS Coronavirus 2 by RT PCR NEGATIVE NEGATIVE Final    Comment: (NOTE) SARS-CoV-2 target nucleic acids are NOT DETECTED.  The SARS-CoV-2 RNA is generally detectable in upper respiratory specimens during the acute phase of infection. The lowest concentration of SARS-CoV-2 viral copies this assay can detect is 138 copies/mL. A negative result does not preclude SARS-Cov-2 infection and should not be used as the sole basis for treatment or other patient management decisions. A negative result may occur with  improper specimen collection/handling, submission of specimen other than nasopharyngeal swab, presence of viral mutation(s) within the areas targeted by this assay, and inadequate number of  viral copies(<138 copies/mL). A negative result must be combined with clinical observations, patient history, and epidemiological information. The expected result is Negative.  Fact Sheet for Patients:  BloggerCourse.com  Fact Sheet for Healthcare Providers:  SeriousBroker.it  This test is no t yet approved or cleared by the [place] FDA and  has been authorized for detection and/or diagnosis of SARS-CoV-2 by FDA under an Emergency Use Authorization (EUA). This EUA will remain  in effect (meaning this test can be used) for the duration of the COVID-19 declaration under Section 564(b)(1) of the Act, 21 U.S.C.section 360bbb-3(b)(1), unless the authorization is terminated  or revoked sooner.       Influenza A by PCR NEGATIVE NEGATIVE Final   Influenza B by PCR NEGATIVE NEGATIVE Final    Comment: (NOTE) The Xpert Xpress SARS-CoV-2/FLU/RSV plus assay is intended as an aid in the  diagnosis of influenza from Nasopharyngeal swab specimens and should not be used as a sole basis for treatment. Nasal washings and aspirates are unacceptable for Xpert Xpress SARS-CoV-2/FLU/RSV testing.  Fact SheetBloggerCourse.[place]/152166/download  Fact Sheet for Healthcare Providers: SeriousBroker.it  This test is not yet approved or cleared by the [place] FDA and has been authorized for detection and/or diagnosis of SARS-CoV-2 by FDA under an Emergency Use Authorization (EUA). This EUA will remain in effect (meaning this test can be used) for the duration of the COVID-19 declaration under Section 564(b)(1) of the Act, 21 U.S.C. section 360bbb-3(b)(1), unless the authorization is terminated or revoked.  Performed at [facility], [address], [place], [place] [address]      Labs: Basic Metabolic Panel: Recent Labs  Lab 06/07/21 1112 06/07/21 1458 06/08/21 0525 06/09/21 0554  NA 136   --  139 136  K 4.1  --  3.9 4.3  CL 103  --  108 106  CO2 22  --  22 23  GLUCOSE 191*  --  185* 128*  BUN 37*  --  26* 20  CREATININE 2.22*  --  1.83* 1.72*  CALCIUM 9.5  --  9.4 8.8*  MG  --  2.2  --   --   PHOS  --  3.8  --   --    Liver Function Tests: Recent Labs  Lab 06/07/21 1112  AST 20  ALT 16  ALKPHOS 62  BILITOT 0.5  PROT 7.0  ALBUMIN 3.8    Recent Labs  Lab 06/07/21 1112  AMMONIA 17   CBC: Recent Labs  Lab 06/07/21 1112 06/08/21 0525  WBC 7.1 7.0  NEUTROABS 4.5  --   HGB 12.1* 12.2*  HCT 36.0* 36.1*  MCV 95.2 95.3  PLT 190 183    CBG: Recent Labs  Lab 06/08/21 1106 06/08/21 1628 06/08/21 2014 06/09/21 0830 06/09/21 1126  GLUCAP 120* 129* 136* 126* 181*    Signed:  Vassie Loll MD.  [facility] 06/09/2021, 5:03 PM

## 2021-06-09 NOTE — Care Management Important Message (Signed)
Important Message  Patient Details  Name: Jon Velazquez MRN: [id number] Date of Birth: July 18, 1951   Medicare Important Message Given:  Yes     Corey Harold 06/09/2021, 12:10 PM

## 2021-06-14 ENCOUNTER — Ambulatory Visit (INDEPENDENT_AMBULATORY_CARE_PROVIDER_SITE_OTHER): Payer: Medicare FFS

## 2021-06-14 DIAGNOSIS — I639 Cerebral infarction, unspecified: Secondary | ICD-10-CM

## 2021-06-16 ENCOUNTER — Other Ambulatory Visit: Payer: Self-pay

## 2021-06-30 ENCOUNTER — Other Ambulatory Visit: Payer: Self-pay

## 2021-06-30 ENCOUNTER — Encounter: Payer: Self-pay | Admitting: Cardiology

## 2021-06-30 ENCOUNTER — Ambulatory Visit: Payer: Medicare FFS | Admitting: Cardiology

## 2021-06-30 VITALS — BP 124/60 | HR 62 | Ht 73.0 in | Wt 194.8 lb

## 2021-06-30 DIAGNOSIS — I480 Paroxysmal atrial fibrillation: Secondary | ICD-10-CM

## 2021-06-30 MED ORDER — APIXABAN 5 MG PO TABS: 5.0000 mg | ORAL_TABLET | Freq: Two times a day (BID) | ORAL | 6 refills | Status: AC

## 2021-06-30 NOTE — Progress Notes (Signed)
Clinical Summary Jon Velazquez is a 70 y.o.male seen today as a new consult, referred by Dr Olena Leatherwood for the following medical problems.   1.CVA - admit 05/2021 with CVA - discharged on aspirin and plavix, without outpatient 30 day monitor    2. New diagnosis paroxysmal Afib - noted on outpatient monitor - also with prolonged 6.8 s pause while in afib at 842 pm. Family reports patient was sleeping at this time, typically goes to bed 7 or 730pm.  - patient denies any palpitations, no lightheadedness, no presyncope or syncope. He is on lopressor at home.     Past Medical History:  Diagnosis Date   COPD (chronic obstructive pulmonary disease) (HCC)    Diabetes mellitus without complication (HCC)    type 2   GERD (gastroesophageal reflux disease)    H/O fracture of ankle    Hyperlipidemia    Hypertension      No Known Allergies   Current Outpatient Medications  Medication Sig Dispense Refill   aspirin EC 325 MG tablet Take 325 mg by mouth daily.      benazepril (LOTENSIN) 40 MG tablet Take 20 mg by mouth daily.     calcium carbonate (OS-CAL) 600 MG TABS tablet Take 600 mg by mouth daily.      chlorthalidone (HYGROTON) 25 MG tablet Take 25 mg by mouth daily.      Cholecalciferol (VITAMIN D-3) 1000 UNITS CAPS Take 1,000 Units by mouth daily.      clopidogrel (PLAVIX) 75 MG tablet Take 75 mg by mouth daily.     fenofibrate 160 MG tablet Take 160 mg by mouth daily.     LANTUS SOLOSTAR 100 UNIT/ML Solostar Pen Inject 12 Units into the skin daily after breakfast.     magnesium oxide (MAG-OX) 400 (241.3 Mg) MG tablet      metoprolol tartrate (LOPRESSOR) 25 MG tablet Take 50 mg by mouth.     nicotine (NICODERM CQ - DOSED IN MG/24 HOURS) 21 mg/24hr patch Place 1 patch (21 mg total) onto the skin daily. 28 patch 0   Omega-3 Fatty Acids (FISH OIL) 1000 MG CAPS Take 1,000 mg by mouth daily.      simvastatin (ZOCOR) 20 MG tablet Take 1 tablet (20 mg total) by mouth daily. 30 tablet 2    sodium bicarbonate 650 MG tablet Take 650 mg by mouth daily.     tiZANidine (ZANAFLEX) 4 MG tablet Take 4 mg by mouth at bedtime.     No current facility-administered medications for this visit.     Past Surgical History:  Procedure Laterality Date   BACK SURGERY  mid 1980s   L5-S1, Dr. Jeannetta Nap, [place]   COLONOSCOPY  2012   MULTIPLE TOOTH EXTRACTIONS     POSTERIOR LUMBAR FUSION 4 LEVEL N/A 06/16/2015   Procedure: POSTERIOR LUMBAR INTERBODY FUSION WITH PEDICLE SCREWS LUMBAR ONE-TWO ,LUMBAR TWO-THREE,LUMBAR THREE-FOUR  ,LUMBAR FOUR-FIVE ;INSERTION BONE GROWTH STIMULATOR ;  Surgeon: Aliene Beams, MD;  Location: [facility];  Service: Neurosurgery;  Laterality: N/A;     No Known Allergies    Family History  Problem Relation Age of Onset   Lung cancer Father    Cancer Sister      Social History Mr. Bushong reports that he has been smoking cigarettes. He has a 75.00 pack-year smoking history. He has never used smokeless tobacco. Mr. Maulden reports no history of alcohol use.   Review of Systems CONSTITUTIONAL: No weight loss, fever, chills, weakness or  fatigue.  HEENT: Eyes: No visual loss, blurred vision, double vision or yellow sclerae.No hearing loss, sneezing, congestion, runny nose or sore throat.  SKIN: No rash or itching.  CARDIOVASCULAR: per hpi RESPIRATORY: No shortness of breath, cough or sputum.  GASTROINTESTINAL: No anorexia, nausea, vomiting or diarrhea. No abdominal pain or blood.  GENITOURINARY: No burning on urination, no polyuria NEUROLOGICAL: No headache, dizziness, syncope, paralysis, ataxia, numbness or tingling in the extremities. No change in bowel or bladder control.  MUSCULOSKELETAL: No muscle, back pain, joint pain or stiffness.  LYMPHATICS: No enlarged nodes. No history of splenectomy.  PSYCHIATRIC: No history of depression or anxiety.  ENDOCRINOLOGIC: No reports of sweating, cold or heat intolerance. No polyuria or polydipsia.   Marland Kitchen   Physical Examination Today's Vitals   06/30/21 0904  BP: 124/60  Pulse: 62  SpO2: 97%  Weight: 194 lb 12.8 oz (88.4 kg)  Height: 6\' 1"  (1.854 m)   Body mass index is 25.7 kg/m.  Gen: resting comfortably, no acute distress HEENT: no scleral icterus, pupils equal round and reactive, no palptable cervical adenopathy,  CV: RRR, no m/r/g no jvd Resp: Clear to auscultation bilaterally GI: abdomen is soft, non-tender, non-distended, normal bowel sounds, no hepatosplenomegaly MSK: extremities are warm, no edema.  Skin: warm, no rash Neuro:  no focal deficits Psych: appropriate affect   Diagnostic Studies     Assessment and Plan  Paroxysmal afib - new diagnosis based on 30 day event monitor which was placed after CVA last month - will d/c aspirin and plavix, start eliquis 5mg  bid.  - no elevated rates, extended 6.8 nocturnal pause. Patient denies any symptoms. We will d/c metoprolol. He has a few weeks left on his monitor, continue to monitor for recurrence of afib and/or pauses.        , M.D

## 2021-06-30 NOTE — Patient Instructions (Signed)
Medication Instructions:  Your physician has recommended you make the following change in your medication:  STOP Aspirin 325 mg tablets STOP Plavix 75 mg tablets STOP Metoprolol 25 mg tablets START Eliquis 5 mg tablets twice daily  *If you need a refill on your cardiac medications before your next appointment, please call your pharmacy*   Lab Work: None If you have labs (blood work) drawn today and your tests are completely normal, you will receive your results only by: MyChart Message (if you have MyChart) OR A paper copy in the mail If you have any lab test that is abnormal or we need to change your treatment, we will call you to review the results.   Testing/Procedures: None   Follow-Up: At [facility], you and your health needs are our priority.  As part of our continuing mission to provide you with exceptional heart care, we have created designated Provider Care Teams.  These Care Teams include your primary Cardiologist (physician) and Advanced Practice Providers (APPs -  Physician Assistants and Nurse Practitioners) who all work together to provide you with the care you need, when you need it.  We recommend signing up for the patient portal called "MyChart".  Sign up information is provided on this After Visit Summary.  MyChart is used to connect with patients for Virtual Visits (Telemedicine).  Patients are able to view lab/test results, encounter notes, upcoming appointments, etc.  Non-urgent messages can be sent to your provider as well.   To learn more about what you can do with MyChart, go to [email or web address].    Your next appointment:   6 week(s)  The format for your next appointment:   In Person  Provider:   You will see one of the following Advanced Practice Providers on your designated Care Team:   Randall An, PA-C  Jacolyn Reedy, [place]      Other Instructions Eliquis Samples 5 mg tablets Lot#: [id number] Exp: 03/2023

## 2021-08-10 NOTE — Progress Notes (Signed)
Cardiology Office Note  Date: 08/11/2021   ID: Jon Velazquez, DOB [birth year], MRN [id number]  PCP:  Jon Deiters, MD  Cardiologist:  Dina Rich, MD Electrophysiologist:  None   Chief Complaint: 6-week follow-up  History of Present Illness: Jon Velazquez is a 70 y.o. male with a history of HTN, HLD, GERD, DM2, COPD, CVA.  He was last seen by Dr. Wyline Mood on 06/30/2021 as a new consult for CVA with admission on 05/2021 with CVA.  He was discharged on aspirin and Plavix.  He was started on a 30-day event monitor.  Had a new diagnosis of PAF noted on outpatient monitor.  He had an prolonged 6.8-second pause while in atrial fibrillation.  Family reported he was sleeping at the time.  He denied any palpitations, lightheadedness, presyncope or syncope.  He was on Lopressor at home.  Plan was to discontinue aspirin and Plavix and start Eliquis 5 mg p.o. twice daily.  Heart rates were not elevated.  He denied any symptoms.  Plan was to discontinue metoprolol.  He still had a few weeks left on his monitor and plan was to continue to monitor for recurrence of atrial fibrillation and/or pauses   He is here for 6-week follow-up.  He denies any neurologic deficits status post CVA.  He states he did have an episode on the 11th where his legs gave away on him and son states he defecated on himself when they were attempting to help him to the commode.  He states he has had no more episodes of this kind.  He denied any issues like facial drooping, unilateral weakness, orthostatic symptoms, palpitations.  Patient's son states he went to bed that evening and got up the next morning and had no issues.  He has had no recurrence of the leg weakness.  Son states he did have some cramping in his legs when this occurred.  He denies any bleeding since starting Eliquis.  He denies any palpitations or arrhythmias.  No further CVA or TIA-like symptoms.  Son states he has an upcoming appointment for follow-up with  neurology.  Blood pressure was initially elevated on arrival.  Recheck in right arm blood pressure was 126/80.  Patient states he continues to smoke between 6 and 7 cigarettes/day.  He remains in normal sinus rhythm with a rate of 78 with marked ST abnormality possible inferior subendocardial injury.  He denies any anginal or exertional symptoms.  Orthostatic symptoms, DOE, SOB, PND, orthopnea, bleeding.  No claudication-like symptoms, DVT or PE-like symptoms, or lower extremity edema.      Past Medical History:  Diagnosis Date   COPD (chronic obstructive pulmonary disease) (HCC)    Diabetes mellitus without complication (HCC)    type 2   GERD (gastroesophageal reflux disease)    H/O fracture of ankle    Hyperlipidemia    Hypertension     Past Surgical History:  Procedure Laterality Date   BACK SURGERY  mid 1980s   L5-S1, [place], [place]   COLONOSCOPY  2012   MULTIPLE TOOTH EXTRACTIONS     POSTERIOR LUMBAR FUSION 4 LEVEL N/A 06/16/2015   Procedure: POSTERIOR LUMBAR INTERBODY FUSION WITH PEDICLE SCREWS LUMBAR ONE-TWO ,LUMBAR TWO-THREE,LUMBAR THREE-FOUR  ,LUMBAR FOUR-FIVE ;INSERTION BONE GROWTH STIMULATOR ;  Surgeon: Aliene Beams, MD;  Location: [facility];  Service: Neurosurgery;  Laterality: N/A;    Current Outpatient Medications  Medication Sig Dispense Refill   apixaban (ELIQUIS) 5 MG TABS tablet Take 1 tablet (5 mg  total) by mouth 2 (two) times daily. 60 tablet 6   benazepril (LOTENSIN) 40 MG tablet Take 20 mg by mouth daily.     calcium carbonate (OS-CAL) 600 MG TABS tablet Take 600 mg by mouth daily.      chlorthalidone (HYGROTON) 25 MG tablet Take 25 mg by mouth daily.      Cholecalciferol (VITAMIN D-3) 1000 UNITS CAPS Take 1,000 Units by mouth daily.      fenofibrate 160 MG tablet Take 160 mg by mouth daily.     LANTUS SOLOSTAR 100 UNIT/ML Solostar Pen Inject 12 Units into the skin daily after breakfast.     magnesium oxide (MAG-OX) 400 (241.3 Mg) MG tablet       nicotine (NICODERM CQ - DOSED IN MG/24 HOURS) 21 mg/24hr patch Place 1 patch (21 mg total) onto the skin daily. 28 patch 0   Omega-3 Fatty Acids (FISH OIL) 1000 MG CAPS Take 1,000 mg by mouth daily.      simvastatin (ZOCOR) 20 MG tablet Take 1 tablet (20 mg total) by mouth daily. 30 tablet 2   sodium bicarbonate 650 MG tablet Take 650 mg by mouth daily.     tiZANidine (ZANAFLEX) 4 MG tablet Take 4 mg by mouth at bedtime.     No current facility-administered medications for this visit.   Allergies:  Patient has no known allergies.   Social History: The patient  reports that he has been smoking cigarettes. He has a 75.00 pack-year smoking history. He has never used smokeless tobacco. He reports that he does not drink alcohol.   Family History: The patient's family history includes Cancer in his sister; Lung cancer in his father.   ROS:  Please see the history of present illness. Otherwise, complete review of systems is positive for none.  All other systems are reviewed and negative.   Physical Exam: VS:  BP (!) 148/70   Pulse 72   Ht 6\' 1"  (1.854 m)   Wt 193 lb 12.8 oz (87.9 kg)   SpO2 93%   BMI 25.57 kg/m , BMI Body mass index is 25.57 kg/m.  Wt Readings from Last 3 Encounters:  08/11/21 193 lb 12.8 oz (87.9 kg)  06/30/21 194 lb 12.8 oz (88.4 kg)  06/07/21 184 lb 15.5 oz (83.9 kg)    General: Patient appears comfortable at rest. Neck: Supple, no elevated JVP or carotid bruits, no thyromegaly. Lungs: Clear to auscultation, nonlabored breathing at rest. Cardiac: Regular rate and rhythm, no S3 or significant systolic murmur, no pericardial rub. Extremities: No pitting edema, distal pulses 2+. Skin: Warm and dry. Musculoskeletal: No kyphosis. Neuropsychiatric: Alert and oriented x3, affect grossly appropriate.  ECG: 08/11/2021 normal sinus rhythm rate of 78, marked ST abnormality, possible inferior subendocardial injury.  Recent Labwork: 06/07/2021: ALT 16; AST 20; Magnesium 2.2;  TSH 1.584 06/08/2021: Hemoglobin 12.2; Platelets 183 06/09/2021: BUN 20; Creatinine, Ser 1.72; Potassium 4.3; Sodium 136     Component Value Date/Time   CHOL 164 06/09/2021 0554   TRIG 113 06/09/2021 0554   HDL 29 (L) 06/09/2021 0554   CHOLHDL 5.7 06/09/2021 0554   VLDL 23 06/09/2021 0554   LDLCALC 112 (H) 06/09/2021 0554    Other Studies Reviewed Today:  Cardiac monitor 06/14/2021 [employer] monitor reviewed.  30 days analyzed.  Predominant rhythm is sinus with heart rate ranging from 46 bpm up to 129 bpm and average heart rate 62 bpm.  Paroxysmal atrial fibrillation/flutter noted,, 2% of total rhythm burden.  There were 9  pauses greater than 3 seconds duration.  Longest pause was 7.2 seconds.  Some of these pauses appear to be post-termination pauses from atrial arrhythmia to sinus.   Echocardiogram 06/09/2021  1. Left ventricular ejection fraction, by estimation, is 60 to 65%. The  left ventricle has normal function. The left ventricle has no regional  wall motion abnormalities. There is moderate concentric left ventricular  hypertrophy. Left ventricular  diastolic parameters are indeterminate. Elevated left ventricular  end-diastolic pressure.   2. Right ventricular systolic function is normal. The right ventricular  size is normal. There is normal pulmonary artery systolic pressure. The  estimated right ventricular systolic pressure is 19.8 mmHg.   3. Left atrial size was mildly dilated.   4. Right atrial size was mildly dilated.   5. The mitral valve is degenerative. Mild to moderate mitral valve  regurgitation.   6. The aortic valve is tricuspid. There is mild calcification of the  aortic valve. Aortic valve regurgitation is not visualized. Mild to  moderate aortic valve sclerosis/calcification is present, without any  evidence of aortic stenosis. Aortic valve mean   gradient measures 5.0 mmHg.   7. Aortic dilatation noted. There is mild dilatation of the aortic root,   measuring 42 mm.   8. The inferior vena cava is normal in size with greater than 50%  respiratory variability, suggesting right atrial pressure of 3 mmHg.   Comparison(s): No prior Echocardiogram.    Carotid artery duplex 06/07/2021 IMPRESSION: 1. Slightly elevated velocities in the right internal carotid artery with moderate plaque burden in the right carotid bulb suggestive of 50-69% stenosis. 2. 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    Assessment and Plan:  1. Cerebrovascular accident (CVA), unspecified mechanism (HCC)   2. PAF (paroxysmal atrial fibrillation) (HCC)   3. Left carotid artery occlusion   4. Carotid artery stenosis with cerebral infarction (HCC)   5. Essential hypertension   6. Mixed hyperlipidemia   7. Stage 3b chronic kidney disease (HCC)   8. Type 2 diabetes mellitus with hyperlipidemia (HCC)   9. Tobacco abuse    1. Cerebrovascular accident (CVA), unspecified mechanism (HCC) Recent CVA affecting the right parietal cortical and subcortical brain consistent with right MCA branch vessel infarction.  Determined to be acute to subacute no evidence of hemorrhagic transformation.  Mild chronic small vessel ischemic change elsewhere affecting the hemispheric white matter.  He has no obvious neurological focal deficits.  His son states he has an upcoming follow-up with neurology.  2. PAF (paroxysmal atrial fibrillation) (HCC) Continue Eliquis 5 mg p.o. twice daily.  EKG today normal sinus rhythm rate of 78, marked ST abnormality, possible inferior subendocardial injury.  3. Left carotid artery occlusion LICA complete occlusion appearing chronic on recent carotid Doppler September 2022  4. Carotid artery stenosis with cerebral infarction [facility]) Recent carotid duplex 06/07/2021 RICA 50 to 69% stenosis, LICA with complete occlusion.   5. Essential hypertension BP today 148/70.  Recheck in right arm 126/80.  Continue  benazepril 20 mg p.o. daily.  Continue chlorthalidone 25 mg daily.  6. Mixed hyperlipidemia Lipid panel 06/09/2021: TC 164, TG 113, HDL 29, LDL 112.  Continue simvastatin 20 mg daily, fenofibrate 160 mg daily.  7.  CKD stage III Recent creatinine 1.72 and GFR 42.  8.  DM2 Recent fasting glucose on 06/09/2021 was 181.  Follows with PCP for diabetes currently taking Lantus 12 units subcu daily after breakfast.  Follow PCP for management.  9.  Tobacco abuse Patient states he has been smoking since age 87.  States he is currently only smoking about 7 cigarettes/day.  Highly advised him to stop smoking given recent CVA, carotid artery disease, hyperlipidemia, hypertension.  Medication Adjustments/Labs and Tests Ordered: Current medicines are reviewed at length with the patient today.  Concerns regarding medicines are outlined above.   Disposition: Follow-up with Dr. Harl Bowie or APP 6 months  Signed, [name], NP 08/11/2021 2:25 PM    [facility] [place], [place], [place] [address] Phone: [phone]; Fax: [phone]

## 2021-08-11 ENCOUNTER — Ambulatory Visit: Payer: Medicare FFS | Admitting: Family Medicine

## 2021-08-11 ENCOUNTER — Encounter: Payer: Self-pay | Admitting: Family Medicine

## 2021-08-11 VITALS — BP 148/70 | HR 72 | Ht 73.0 in | Wt 193.8 lb

## 2021-08-11 DIAGNOSIS — E785 Hyperlipidemia, unspecified: Secondary | ICD-10-CM

## 2021-08-11 DIAGNOSIS — E782 Mixed hyperlipidemia: Secondary | ICD-10-CM

## 2021-08-11 DIAGNOSIS — I6522 Occlusion and stenosis of left carotid artery: Secondary | ICD-10-CM | POA: Diagnosis not present

## 2021-08-11 DIAGNOSIS — I480 Paroxysmal atrial fibrillation: Secondary | ICD-10-CM | POA: Diagnosis not present

## 2021-08-11 DIAGNOSIS — I63239 Cerebral infarction due to unspecified occlusion or stenosis of unspecified carotid arteries: Secondary | ICD-10-CM

## 2021-08-11 DIAGNOSIS — I639 Cerebral infarction, unspecified: Secondary | ICD-10-CM

## 2021-08-11 DIAGNOSIS — N1832 Chronic kidney disease, stage 3b: Secondary | ICD-10-CM

## 2021-08-11 DIAGNOSIS — I10 Essential (primary) hypertension: Secondary | ICD-10-CM

## 2021-08-11 DIAGNOSIS — E1169 Type 2 diabetes mellitus with other specified complication: Secondary | ICD-10-CM

## 2021-08-11 DIAGNOSIS — Z720 Tobacco use: Secondary | ICD-10-CM

## 2021-08-11 NOTE — Patient Instructions (Addendum)
Medication Instructions:  Your physician recommends that you continue on your current medications as directed. Please refer to the Current Medication list given to you today.  Labwork: none  Testing/Procedures: none  Follow-Up: Your physician recommends that you schedule a follow-up appointment in: 6 months  Any Other Special Instructions Will Be Listed Below (If Applicable). We will order you a blood pressure monitor and contact when you its available for pick up.  If you need a refill on your cardiac medications before your next appointment, please call your pharmacy.

## 2021-09-06 ENCOUNTER — Encounter: Payer: Self-pay | Admitting: *Deleted

## 2021-09-07 ENCOUNTER — Ambulatory Visit: Payer: Medicare FFS | Admitting: Neurology

## 2021-09-11 ENCOUNTER — Ambulatory Visit: Payer: Medicare FFS | Admitting: Diagnostic Neuroimaging

## 2021-09-11 ENCOUNTER — Other Ambulatory Visit: Payer: Self-pay

## 2021-09-11 ENCOUNTER — Encounter: Payer: Self-pay | Admitting: Diagnostic Neuroimaging

## 2021-09-11 VITALS — BP 208/86 | HR 72 | Ht 73.0 in | Wt 193.0 lb

## 2021-09-11 DIAGNOSIS — I63411 Cerebral infarction due to embolism of right middle cerebral artery: Secondary | ICD-10-CM

## 2021-09-11 DIAGNOSIS — I63231 Cerebral infarction due to unspecified occlusion or stenosis of right carotid arteries: Secondary | ICD-10-CM

## 2021-09-11 NOTE — Progress Notes (Signed)
[facility]  PATIENT: Jon Velazquez DOB: 1951-04-09  REFERRING CLINICIAN: Toma Deiters, MD HISTORY FROM: patient and son REASON FOR VISIT: new consult    HISTORICAL  CHIEF COMPLAINT:  Chief Complaint  Patient presents with   Cerebral infarction    Rm 6 New Pt, son- Saige "had heart monitor 30 days then saw cardiology; has upcoming apt with PCP; legs cramp and go out from under him"    HISTORY OF PRESENT ILLNESS:   70 year old male here for evaluation of stroke.  History of hypertension, diabetes, smoking.  Patient presented to hospital in September 2022 for confusion, found to have right parietal subacute ischemic infarction.  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  He was treated with dual antiplatelet therapy.  Outpatient cardiac monitoring was performed which found paroxysmal atrial fibrillation.  Now he is on Eliquis anticoagulation.  He has cut down smoking somewhat.  He is taking medications for diabetes and hypertension.  On statin and fish oil.   REVIEW OF SYSTEMS: Full 14 system review of systems performed and negative with exception of: as per HPI.  ALLERGIES: No Known Allergies  HOME MEDICATIONS: Outpatient Medications Prior to Visit  Medication Sig Dispense Refill   apixaban (ELIQUIS) 5 MG TABS tablet Take 1 tablet (5 mg total) by mouth 2 (two) times daily. 60 tablet 6   benazepril (LOTENSIN) 40 MG tablet Take 20 mg by mouth daily.     calcium carbonate (OS-CAL) 600 MG TABS tablet Take 600 mg by mouth daily.      chlorthalidone (HYGROTON) 25 MG tablet Take 25 mg by mouth daily.      Cholecalciferol (VITAMIN D-3) 1000 UNITS CAPS Take 1,000 Units by mouth daily.      fenofibrate 160 MG tablet Take 160 mg by mouth daily.     LANTUS SOLOSTAR 100 UNIT/ML Solostar Pen Inject 12 Units into the skin daily after breakfast.     magnesium oxide (MAG-OX) 400 (241.3 Mg) MG tablet      Omega-3 Fatty Acids (FISH OIL)  1000 MG CAPS Take 1,000 mg by mouth daily.      simvastatin (ZOCOR) 20 MG tablet Take 1 tablet (20 mg total) by mouth daily. 30 tablet 2   sodium bicarbonate 650 MG tablet Take 650 mg by mouth daily.     tiZANidine (ZANAFLEX) 4 MG tablet Take 4 mg by mouth at bedtime.     nicotine (NICODERM CQ - DOSED IN MG/24 HOURS) 21 mg/24hr patch Place 1 patch (21 mg total) onto the skin daily. (Patient not taking: Reported on 09/11/2021) 28 patch 0   No facility-administered medications prior to visit.    PAST MEDICAL HISTORY: Past Medical History:  Diagnosis Date   Cerebral infarction (HCC) 05/2021   COPD (chronic obstructive pulmonary disease) (HCC)    Diabetes mellitus without complication (HCC)    type 2   GERD (gastroesophageal reflux disease)    H/O fracture of ankle    Hyperlipidemia    Hypertension    Stroke [facility])     PAST SURGICAL HISTORY: Past Surgical History:  Procedure Laterality Date   BACK SURGERY  mid 1980s   L5-S1, Dr. Jeannetta [place] [place]   COLONOSCOPY  2012   MULTIPLE TOOTH EXTRACTIONS     POSTERIOR LUMBAR FUSION 4 LEVEL N/A 06/16/2015   Procedure: POSTERIOR LUMBAR INTERBODY FUSION WITH PEDICLE SCREWS LUMBAR ONE-TWO ,LUMBAR TWO-THREE,LUMBAR THREE-FOUR  ,LUMBAR FOUR-FIVE ;INSERTION BONE GROWTH STIMULATOR ;  Surgeon:  Aliene Beams, MD;  Location: [facility];  Service: Neurosurgery;  Laterality: N/A;    FAMILY HISTORY: Family History  Problem Relation Age of Onset   Lung cancer Father    Cancer Sister     SOCIAL HISTORY: Social History   Socioeconomic History   Marital status: Divorced    Spouse name: Not on file   Number of children: 5   Years of education: Not on file   Highest education level: High school graduate  Occupational History   Not on file  Tobacco Use   Smoking status: Every Day    Packs/day: 1.25    Years: 50.00    Pack years: 62.50    Types: Cigarettes   Smokeless tobacco: Never  Vaping Use   Vaping Use: Never used  Substance and Sexual  Activity   Alcohol use: No    Alcohol/week: 0.0 standard drinks   Drug use: Never   Sexual activity: Not on file  Other Topics Concern   Not on file  Social History Narrative   09/11/21 son, Emillio lives with him   Caffeine 1-2 a day   Social Determinants of Health   Financial Resource Strain: Not on file  Food Insecurity: Not on file  Transportation Needs: Not on file  Physical Activity: Not on file  Stress: Not on file  Social Connections: Not on file  Intimate Partner Violence: Not on file     PHYSICAL EXAM  GENERAL EXAM/CONSTITUTIONAL: Vitals:  Vitals:   09/11/21 1552  BP: (!) 208/86  Pulse: 72  Weight: 193 lb (87.5 kg)  Height: 6\' 1"  (1.854 m)   Body mass index is 25.46 kg/m. Wt Readings from Last 3 Encounters:  09/11/21 193 lb (87.5 kg)  08/11/21 193 lb 12.8 oz (87.9 kg)  06/30/21 194 lb 12.8 oz (88.4 kg)   Patient is in no distress; SOMEWHAT DISHEVELED; SMELLS LIKE CIG SMOKE; neck is supple  CARDIOVASCULAR: Examination of carotid arteries is normal; no carotid bruits Regular rate and rhythm, no murmurs Examination of peripheral vascular system by observation and palpation is normal  EYES: Ophthalmoscopic exam of optic discs and posterior segments is normal; no papilledema or hemorrhages No results found.  MUSCULOSKELETAL: Gait, strength, tone, movements noted in Neurologic exam below  NEUROLOGIC: MENTAL STATUS:  No flowsheet data found. awake, alert, oriented to person, place and time recent and remote memory intact normal attention and concentration language fluent, comprehension intact, naming intact fund of knowledge appropriate NO EXTINCTION  CRANIAL NERVE:  2nd - no papilledema on fundoscopic exam 2nd, 3rd, 4th, 6th - pupils equal and reactive to light, visual fields full to confrontation, extraocular muscles intact, no nystagmus 5th - facial sensation symmetric 7th - facial strength symmetric 8th - hearing intact 9th - palate elevates  symmetrically, uvula midline 11th - shoulder shrug symmetric 12th - tongue protrusion midline  MOTOR:  normal bulk and tone, full strength in the BUE, BLE  SENSORY:  normal and symmetric to light touch, temperature, vibration  COORDINATION:  finger-nose-finger, fine finger movements normal  REFLEXES:  deep tendon reflexes TRACE and symmetric  GAIT/STATION:  narrow based gait     DIAGNOSTIC DATA (LABS, IMAGING, TESTING) - I reviewed patient records, labs, notes, testing and imaging myself where available.  Lab Results  Component Value Date   WBC 7.0 06/08/2021   HGB 12.2 (L) 06/08/2021   HCT 36.1 (L) 06/08/2021   MCV 95.3 06/08/2021   PLT 183 06/08/2021      Component Value  Date/Time   NA 136 06/09/2021 0554   K 4.3 06/09/2021 0554   CL 106 06/09/2021 0554   CO2 23 06/09/2021 0554   GLUCOSE 128 (H) 06/09/2021 0554   BUN 20 06/09/2021 0554   CREATININE 1.72 (H) 06/09/2021 0554   CALCIUM 8.8 (L) 06/09/2021 0554   PROT 7.0 06/07/2021 1112   ALBUMIN 3.8 06/07/2021 1112   AST 20 06/07/2021 1112   ALT 16 06/07/2021 1112   ALKPHOS 62 06/07/2021 1112   BILITOT 0.5 06/07/2021 1112   GFRNONAA 42 (L) 06/09/2021 0554   GFRAA 45 (L) 06/17/2015 0521   Lab Results  Component Value Date   CHOL 164 06/09/2021   HDL 29 (L) 06/09/2021   LDLCALC 112 (H) 06/09/2021   TRIG 113 06/09/2021   CHOLHDL 5.7 06/09/2021   Lab Results  Component Value Date   HGBA1C 7.2 (H) 06/07/2021   Lab Results  Component Value Date   VITAMINB12 257 06/07/2021   Lab Results  Component Value Date   TSH 1.584 06/07/2021    06/16/15 xray lumbar spine - Bilateral pedicle screws at every level from L1 through L5. - Appropriately positioned interbody fusion plugs in the disc spaces from L1- 2 through L4-5.   06/07/21 CT head [I reviewed images myself and agree with interpretation. -VRP]  1. Low-density in the right parietal cortex is suggestive for cytotoxic edema. Findings are suggestive  for an age-indeterminate infarct. No evidence for acute hemorrhage. 2. Cerebral atrophy and evidence for chronic small vessel ischemic changes.  06/07/21 carotid u/s 1. Slightly elevated velocities in the right internal carotid artery with moderate plaque burden in the right carotid bulb suggestive of 50-69% stenosis. 2. 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  06/08/21 CT head [I reviewed images myself and agree with interpretation. -VRP]  No change since yesterday. Acute infarction affecting the right parietal cortical and subcortical brain consistent with right MCA branch vessel infarction. This is acute to subacute. No evidence of hemorrhagic transformation.   Mild chronic small-vessel ischemic change elsewhere affecting the hemispheric white matter.    ASSESSMENT AND PLAN  70 y.o. year old male here with:   Dx:  1. Cerebrovascular accident (CVA) due to embolism of right middle cerebral artery (HCC)   2. 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    Suanne Marker, MD 09/11/2021, 4:50 PM Certified in Neurology, Neurophysiology and Neuroimaging  [facility] [address] [place], [place] [address] [phone]

## 2021-09-11 NOTE — Patient Instructions (Signed)
°  RIGHT PARIETAL STROKE (new dx of pAfib noted) - continue eliquis, statin, DM and BP control  SYMPTOMATIC RIGHT ICA STENOSIS (50-69% by u/s) - refer to vascular surgery for evaluation; may need CTA or conventional angiogram to confirm degree of stenosis

## 2021-09-20 ENCOUNTER — Other Ambulatory Visit: Payer: Self-pay

## 2021-09-20 ENCOUNTER — Ambulatory Visit: Payer: Medicare FFS | Admitting: Vascular Surgery

## 2021-09-20 ENCOUNTER — Encounter: Payer: Self-pay | Admitting: Vascular Surgery

## 2021-09-20 VITALS — BP 168/86 | HR 90 | Temp 98.4°F | Wt 192.2 lb

## 2021-09-20 DIAGNOSIS — I6523 Occlusion and stenosis of bilateral carotid arteries: Secondary | ICD-10-CM

## 2021-09-20 NOTE — Progress Notes (Signed)
[facility] [place]  Patient name: Jon Velazquez MRN: [id number] DOB: Mar 05, 1951 Sex: male  REASON FOR CONSULT: Evaluation of symptomatic right carotid stenosis  HPI: Jon Velazquez is a 70 y.o. male, who is here today for evaluation of carotid disease.  He is here today with his son who is quite attentive and has many appropriate questions regarding his father's care.  In September he presented with confusion.  Work-up included CT scan showing subacute right parietal ischemic infarct.  The patient's son feels that he has made improvement but continues to have some occasional confusion.  Apparently may have some underlying dementia as well.  He specifically denies any focal neurologic deficits.  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  Carotid duplex in September 2022 around the time of his stroke admission revealed occlusion of his left internal carotid artery and a 50 to 69% right carotid stenosis.  He subsequently underwent a 30-day event monitor monitor from cardiac standpoint and did not have any evidence of cardiogenic source for his MCA infarct.  He recently was seen by Dr.Penumalli with [facility] and is referred here with probable symptomatic right internal carotid artery stenosis.  He is right-handed.  Past Medical History:  Diagnosis Date   Cerebral infarction (HCC) 05/2021   COPD (chronic obstructive pulmonary disease) (HCC)    Diabetes mellitus without complication (HCC)    type 2   GERD (gastroesophageal reflux disease)    H/O fracture of ankle    Hyperlipidemia    Hypertension    Stroke [facility])     Family History  Problem Relation Age of Onset   Lung cancer Father    Cancer Sister     SOCIAL HISTORY: Social History   Socioeconomic History   Marital status: Divorced    Spouse name: Not on file   Number of  children: 5   Years of education: Not on file   Highest education level: High school graduate  Occupational History   Not on file  Tobacco Use   Smoking status: Every Day    Packs/day: 1.25    Years: 50.00    Pack years: 62.50    Types: Cigarettes   Smokeless tobacco: Never  Vaping Use   Vaping Use: Never used  Substance and Sexual Activity   Alcohol use: No    Alcohol/week: 0.0 standard drinks   Drug use: Never   Sexual activity: Not on file  Other Topics Concern   Not on file  Social History Narrative   09/11/21 son, Conner lives with him   Caffeine 1-2 a day   Social Determinants of Health   Financial Resource Strain: Not on file  Food Insecurity: Not on file  Transportation Needs: Not on file  Physical Activity: Not on file  Stress: Not on file  Social Connections: Not on file  Intimate Partner Violence: Not on file    No Known Allergies  Current Outpatient Medications  Medication Sig Dispense Refill   apixaban (ELIQUIS) 5 MG TABS tablet Take 1 tablet (5 mg total) by mouth 2 (two) times daily. 60 tablet 6   benazepril (LOTENSIN) 40 MG tablet Take 20 mg by mouth daily.     calcium carbonate (OS-CAL) 600 MG TABS tablet Take 600 mg by mouth daily.      chlorthalidone (HYGROTON) 25 MG tablet Take 25 mg by mouth daily.  Cholecalciferol (VITAMIN D-3) 1000 UNITS CAPS Take 1,000 Units by mouth daily.      fenofibrate 160 MG tablet Take 160 mg by mouth daily.     LANTUS SOLOSTAR 100 UNIT/ML Solostar Pen Inject 12 Units into the skin daily after breakfast.     magnesium oxide (MAG-OX) 400 (241.3 Mg) MG tablet      nicotine (NICODERM CQ - DOSED IN MG/24 HOURS) 21 mg/24hr patch Place 1 patch (21 mg total) onto the skin daily. (Patient not taking: Reported on 09/11/2021) 28 patch 0   Omega-3 Fatty Acids (FISH OIL) 1000 MG CAPS Take 1,000 mg by mouth daily.      simvastatin (ZOCOR) 20 MG tablet Take 1 tablet (20 mg total) by mouth daily. 30 tablet 2   sodium bicarbonate  650 MG tablet Take 650 mg by mouth daily.     tiZANidine (ZANAFLEX) 4 MG tablet Take 4 mg by mouth at bedtime.     No current facility-administered medications for this visit.    REVIEW OF SYSTEMS:  [X]  denotes positive finding, [ ]  denotes negative finding Cardiac  Comments:  Chest pain or chest pressure:    Shortness of breath upon exertion:    Short of breath when lying flat:    Irregular heart rhythm:        Vascular    Pain in calf, thigh, or hip brought on by ambulation:    Pain in feet at night that wakes you up from your sleep:     Blood clot in your veins:    Leg swelling:         Pulmonary    Oxygen at home:    Productive cough:     Wheezing:         Neurologic    Sudden weakness in arms or legs:     Sudden numbness in arms or legs:     Sudden onset of difficulty speaking or slurred speech:    Temporary loss of vision in one eye:     Problems with dizziness:         Gastrointestinal    Blood in stool:     Vomited blood:         Genitourinary    Burning when urinating:     Blood in urine:        Psychiatric    Major depression:         Hematologic    Bleeding problems:    Problems with blood clotting too easily:        Skin    Rashes or ulcers:        Constitutional    Fever or chills:      PHYSICAL EXAM: Vitals:   09/20/21 1128  BP: (!) 168/86  Pulse: 90  Temp: 98.4 F (36.9 C)  TempSrc: Temporal  SpO2: 99%  Weight: 192 lb 4 oz (87.2 kg)    GENERAL: The patient is a well-nourished male, in no acute distress. The vital signs are documented above. CARDIOVASCULAR: Carotid arteries without bruits bilaterally.  2+ radial pulses bilaterally. PULMONARY: There is good air exchange  MUSCULOSKELETAL: There are no major deformities or cyanosis. NEUROLOGIC: No focal weakness or paresthesias are detected. SKIN: There are no ulcers or rashes noted. PSYCHIATRIC: The patient has a normal affect.  DATA:  CT imaging and duplex from September 2022  reviewed and discussed as above  MEDICAL ISSUES: Right MCA distribution stroke with 50 to 69% right carotid stenosis and contralateral left  internal carotid artery occlusion.  I did explain that contralateral occlusion can occasionally cause false elevation and interpretation of stenosis.  I have recommended CT angiogram of the neck for further evaluation.  Patient does have a baseline creatinine of 1.7 but I feel that this additional information is critical to his care.  Had a long discussion with the patient and his son regarding treatment options.  I discussed possibility of standard carotid endarterectomy and alsoTCAR depending on his carotid anatomy.  We will obtain CT angiogram as an outpatient at [facility] and I will discuss this with him by telephone following this to make further recommendations.  Both the patient and his son agree with this plan.  They understand that the surgery will be done in [place] at [facility] if indicated.  They understand also that one of my partners will be doing the surgery.  I did discuss risk to include 1 to 2% risk of stroke with surgery and also chance of cranial nerve injury.  We will discuss further pending CT scan   Larina Earthly, MD [facility] [phone] Pager [phone]  Note: Portions of this report may have been transcribed using voice recognition software.  Every effort has been made to ensure accuracy; however, inadvertent computerized transcription errors may still be present.

## 2021-09-21 ENCOUNTER — Other Ambulatory Visit: Payer: Self-pay

## 2021-09-21 DIAGNOSIS — I6523 Occlusion and stenosis of bilateral carotid arteries: Secondary | ICD-10-CM

## 2021-10-17 LAB — BUN+CREAT
BUN/Creatinine Ratio: 18 (ref 10–24)
BUN: 35 mg/dL — ABNORMAL HIGH (ref 8–27)
Creatinine, Ser: 1.94 mg/dL — ABNORMAL HIGH (ref 0.76–1.27)
eGFR: 36 mL/min/{1.73_m2} — ABNORMAL LOW (ref >59)

## 2021-10-20 ENCOUNTER — Other Ambulatory Visit: Payer: Self-pay

## 2021-10-20 ENCOUNTER — Ambulatory Visit (HOSPITAL_COMMUNITY)
Admission: RE | Admit: 2021-10-20 | Discharge: 2021-10-20 | Disposition: A | Discharge status: Home / Self Care | Payer: Medicare FFS | Source: Ambulatory Visit | Attending: Vascular Surgery | Admitting: Vascular Surgery

## 2021-10-20 ENCOUNTER — Encounter (HOSPITAL_COMMUNITY): Payer: Self-pay

## 2021-10-20 DIAGNOSIS — I6523 Occlusion and stenosis of bilateral carotid arteries: Secondary | ICD-10-CM | POA: Insufficient documentation

## 2021-10-20 MED ORDER — IOHEXOL 350 MG/ML SOLN
60.0000 mL | Freq: Once | INTRAVENOUS | Status: AC | PRN
  Administered 2021-10-20: 60 mL via INTRAVENOUS

## 2021-11-07 ENCOUNTER — Encounter: Payer: Medicare FFS | Admitting: Vascular Surgery

## 2021-11-13 NOTE — H&P (View-Only) (Signed)
[facility] OF [place]  ASSESSMENT / PLAN: Jon Velazquez is a 71 y.o. 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 The reading radiologist feels his stenosis is <50%, but I disagree. There is a clear "shelf" in the carotid bifurcation. The stenosis is >50% based on NASCET criteria. I think he would be best served by a TCAR, given his contralateral occlusion.  The patient should continue best medical therapy for carotid artery stenosis including: Complete cessation from all tobacco products. Blood glucose control with goal A1c < 7%. Blood pressure control with goal blood pressure < 140/90 mmHg. Lipid reduction therapy with goal LDL-C <100 mg/dL (<70 if symptomatic from carotid artery stenosis).  Aspirin 81mg  PO QD.  Clopidogrel 75mg  PO QD. Atorvastatin 40-80mg  PO QD (or other "high intensity" statin therapy).  I discussed the case with Dr. Dagoberto Ligas. We will stop his Eliquis now. We will start DAPT with ASA / Plavix. We will continue this through one month after surgery, at which time we will stop Plavix and resume Eliquis. I reviewed this in detail with the son, who is very attentive.  Plan R TCAR 3/2.  CHIEF COMPLAINT: symptomatic carotid stenosis  HISTORY OF PRESENT ILLNESS: Jon Velazquez is a 71 y.o. male who presents to care for evaluation of possible symptomatic right carotid artery stenosis.  The patient has had 3 TIAs over the past several months.  All of them have involved left-sided weakness, and aphasia.  He has thankfully recovered from these without any deficit.  The patient has been seen by my partner, Dr. Curt Jews, who thought a TCAR would be appropriate for him.  A CT angiogram was performed.  I reviewed this personally.  I disagree with the attending radiologist read.  I think he has a focal plaque which is significant in his carotid and causing turbulent flow.  This could easily explain transient ischemic attacks.  The stenosis is at least  50% or more based on NASCET criteria.  Past Medical History:  Diagnosis Date   Cerebral infarction ([place]) 05/2021   COPD (chronic obstructive pulmonary disease) (HCC)    Diabetes mellitus without complication (HCC)    type 2   GERD (gastroesophageal reflux disease)    H/O fracture of ankle    Hyperlipidemia    Hypertension    Stroke [facility])     Past Surgical History:  Procedure Laterality Date   BACK SURGERY  mid 1980s   L5-S1, Dr. Arelia Sneddon, [place] 4 LEVEL N/A 06/16/2015   Procedure: POSTERIOR LUMBAR INTERBODY FUSION WITH PEDICLE SCREWS LUMBAR ONE-TWO ,LUMBAR TWO-THREE,LUMBAR THREE-FOUR  ,LUMBAR FOUR-FIVE ;INSERTION BONE GROWTH STIMULATOR ;  Surgeon: Karie Chimera, MD;  Location: [place] [facility];  Service: Neurosurgery;  Laterality: N/A;    Family History  Problem Relation Age of Onset   Lung cancer Father    Cancer Sister     Social History   Socioeconomic History   Marital status: Divorced    Spouse name: Not on file   Number of children: 5   Years of education: Not on file   Highest education level: High school graduate  Occupational History   Not on file  Tobacco Use   Smoking status: Every Day    Packs/day: 1.00    Years: 50.00    Pack years: 50.00    Types: Cigarettes   Smokeless tobacco: Never  Vaping Use  Vaping Use: Never used  Substance and Sexual Activity   Alcohol use: No    Alcohol/week: 0.0 standard drinks   Drug use: Never   Sexual activity: Not on file  Other Topics Concern   Not on file  Social History Narrative   09/11/21 son, [place] lives with him   Caffeine 1-2 a day   Social Determinants of Health   Financial Resource Strain: Not on file  Food Insecurity: Not on file  Transportation Needs: Not on file  Physical Activity: Not on file  Stress: Not on file  Social Connections: Not on file  Intimate Partner Violence: Not on file    No Known  Allergies  Current Outpatient Medications  Medication Sig Dispense Refill   apixaban (ELIQUIS) 5 MG TABS tablet Take 1 tablet (5 mg total) by mouth 2 (two) times daily. 60 tablet 6   benazepril (LOTENSIN) 40 MG tablet Take 20 mg by mouth daily.     calcium carbonate (OS-CAL) 600 MG TABS tablet Take 600 mg by mouth daily.      chlorthalidone (HYGROTON) 25 MG tablet Take 25 mg by mouth daily.      Cholecalciferol (VITAMIN D-3) 1000 UNITS CAPS Take 1,000 Units by mouth daily.      fenofibrate 160 MG tablet Take 160 mg by mouth daily.     LANTUS SOLOSTAR 100 UNIT/ML Solostar Pen Inject 12 Units into the skin daily after breakfast.     magnesium oxide (MAG-OX) 400 (241.3 Mg) MG tablet      Omega-3 Fatty Acids (FISH OIL) 1000 MG CAPS Take 1,000 mg by mouth daily.      simvastatin (ZOCOR) 20 MG tablet Take 1 tablet (20 mg total) by mouth daily. 30 tablet 2   sodium bicarbonate 650 MG tablet Take 650 mg by mouth daily.     tiZANidine (ZANAFLEX) 4 MG tablet Take 4 mg by mouth at bedtime.     nicotine (NICODERM CQ - DOSED IN MG/24 HOURS) 21 mg/24hr patch Place 1 patch (21 mg total) onto the skin daily. (Patient not taking: Reported on 11/14/2021) 28 patch 0   No current facility-administered medications for this visit.    PHYSICAL EXAM Vitals:   11/14/21 1047  BP: (!) 148/74  Pulse: 65  Resp: 20  Temp: 98.8 F (37.1 C)  SpO2: 97%  Weight: 192 lb (87.1 kg)  Height: 6\' 1"  (1.854 m)    Constitutional: chronically ill appearing. no distress. Appears well nourished.  Neurologic: CN intact. no focal findings. no sensory loss. Psychiatric:  Mood and affect symmetric and appropriate. Eyes:  No icterus. No conjunctival pallor. Ears, nose, throat:  mucous membranes moist. Midline trachea.  Cardiac: regular rate and rhythm.  Respiratory:  unlabored. Abdominal:  soft, non-tender, non-distended.  Peripheral vascular: 2+ radial pulses Extremity: no edema. no cyanosis. no pallor.  Skin: no  gangrene. no ulceration.  Lymphatic: no Stemmer's sign. no palpable lymphadenopathy.  PERTINENT LABORATORY AND RADIOLOGIC DATA  Most recent CBC CBC Latest Ref Rng & Units 06/08/2021 06/07/2021 06/17/2015  WBC 4.0 - 10.5 K/uL 7.0 7.1 15.8(H)  Hemoglobin 13.0 - 17.0 g/dL 12.2(L) 12.1(L) 10.2(L)  Hematocrit 39.0 - 52.0 % 36.1(L) 36.0(L) 29.2(L)  Platelets 150 - 400 K/uL 183 190 180     Most recent CMP CMP Latest Ref Rng & Units 10/16/2021 06/09/2021 06/08/2021  Glucose 70 - 99 mg/dL - 128(H) 185(H)  BUN 8 - 27 mg/dL 35(H) 20 26(H)  Creatinine 0.76 - 1.27 mg/dL 1.94(H) 1.72(H) 1.83(H)  Sodium 135 - 145 mmol/L - 136 139  Potassium 3.5 - 5.1 mmol/L - 4.3 3.9  Chloride 98 - 111 mmol/L - 106 108  CO2 22 - 32 mmol/L - 23 22  Calcium 8.9 - 10.3 mg/dL - 8.8(L) 9.4  Total Protein 6.5 - 8.1 g/dL - - -  Total Bilirubin 0.3 - 1.2 mg/dL - - -  Alkaline Phos 38 - 126 U/L - - -  AST 15 - 41 U/L - - -  ALT 0 - 44 U/L - - -    Renal function CrCl cannot be calculated (Patient's most recent lab result is older than the maximum 21 days allowed.).  Hgb A1c MFr Bld (%)  Date Value  06/07/2021 7.2 (H)    LDL Cholesterol  Date Value Ref Range Status  06/09/2021 112 (H) 0 - 99 mg/dL Final    Comment:           Total Cholesterol/HDL:CHD Risk Coronary Heart Disease Risk Table                     Men   Women  1/2 Average Risk   3.4   3.3  Average Risk       5.0   4.4  2 X Average Risk   9.6   7.1  3 X Average Risk  23.4   11.0        Use the calculated Patient Ratio above and the CHD Risk Table to determine the patient's CHD Risk.        ATP III CLASSIFICATION (LDL):  <100     mg/dL   Optimal  100-129  mg/dL   Near or Above                    Optimal  130-159  mg/dL   Borderline  160-189  mg/dL   High  >190     mg/dL   Very High Performed at [address] [place], [place] [address]      CT angiogram head and neck reviewed this personally.  I disagree with the attending  radiologist read.  I think he has a focal plaque which is significant in his carotid and causing turbulent flow.  This could easily explain transient ischemic attacks.  The stenosis is at least 50% or more based on NASCET criteria.  Yevonne Aline. Stanford Breed, MD [facility] Phone Number: [phone] 11/14/2021 3:31 PM  Total time spent on preparing this encounter including chart review, data review, collecting history, examining the patient, coordinating care for this established patient, 40 minutes.  Portions of this report may have been transcribed using voice recognition software.  Every effort has been made to ensure accuracy; however, inadvertent computerized transcription errors may still be present.

## 2021-11-13 NOTE — Progress Notes (Signed)
[facility] OF [place]  ASSESSMENT / PLAN: Jon Velazquez is a 71 y.o. 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 The reading radiologist feels his stenosis is <50%, but I disagree. There is a clear "shelf" in the carotid bifurcation. The stenosis is >50% based on NASCET criteria. I think he would be best served by a TCAR, given his contralateral occlusion.  The patient should continue best medical therapy for carotid artery stenosis including: Complete cessation from all tobacco products. Blood glucose control with goal A1c < 7%. Blood pressure control with goal blood pressure < 140/90 mmHg. Lipid reduction therapy with goal LDL-C <100 mg/dL (<70 if symptomatic from carotid artery stenosis).  Aspirin 81mg  PO QD.  Clopidogrel 75mg  PO QD. Atorvastatin 40-80mg  PO QD (or other "high intensity" statin therapy).  I discussed the case with Dr. Dagoberto Ligas. We will stop his Eliquis now. We will start DAPT with ASA / Plavix. We will continue this through one month after surgery, at which time we will stop Plavix and resume Eliquis. I reviewed this in detail with the son, who is very attentive.  Plan R TCAR 3/2.  CHIEF COMPLAINT: symptomatic carotid stenosis  HISTORY OF PRESENT ILLNESS: Jon Velazquez is a 71 y.o. male who presents to care for evaluation of possible symptomatic right carotid artery stenosis.  The patient has had 3 TIAs over the past several months.  All of them have involved left-sided weakness, and aphasia.  He has thankfully recovered from these without any deficit.  The patient has been seen by my partner, Dr. Curt Jews, who thought a TCAR would be appropriate for him.  A CT angiogram was performed.  I reviewed this personally.  I disagree with the attending radiologist read.  I think he has a focal plaque which is significant in his carotid and causing turbulent flow.  This could easily explain transient ischemic attacks.  The stenosis is at least  50% or more based on NASCET criteria.  Past Medical History:  Diagnosis Date   Cerebral infarction ([place]) 05/2021   COPD (chronic obstructive pulmonary disease) (HCC)    Diabetes mellitus without complication (HCC)    type 2   GERD (gastroesophageal reflux disease)    H/O fracture of ankle    Hyperlipidemia    Hypertension    Stroke [facility])     Past Surgical History:  Procedure Laterality Date   BACK SURGERY  mid 1980s   L5-S1, Dr. Arelia Sneddon, [place] 4 LEVEL N/A 06/16/2015   Procedure: POSTERIOR LUMBAR INTERBODY FUSION WITH PEDICLE SCREWS LUMBAR ONE-TWO ,LUMBAR TWO-THREE,LUMBAR THREE-FOUR  ,LUMBAR FOUR-FIVE ;INSERTION BONE GROWTH STIMULATOR ;  Surgeon: Karie Chimera, MD;  Location: [place] [facility];  Service: Neurosurgery;  Laterality: N/A;    Family History  Problem Relation Age of Onset   Lung cancer Father    Cancer Sister     Social History   Socioeconomic History   Marital status: Divorced    Spouse name: Not on file   Number of children: 5   Years of education: Not on file   Highest education level: High school graduate  Occupational History   Not on file  Tobacco Use   Smoking status: Every Day    Packs/day: 1.00    Years: 50.00    Pack years: 50.00    Types: Cigarettes   Smokeless tobacco: Never  Vaping Use  Vaping Use: Never used  Substance and Sexual Activity   Alcohol use: No    Alcohol/week: 0.0 standard drinks   Drug use: Never   Sexual activity: Not on file  Other Topics Concern   Not on file  Social History Narrative   09/11/21 son, [place] lives with him   Caffeine 1-2 a day   Social Determinants of Health   Financial Resource Strain: Not on file  Food Insecurity: Not on file  Transportation Needs: Not on file  Physical Activity: Not on file  Stress: Not on file  Social Connections: Not on file  Intimate Partner Violence: Not on file    No Known  Allergies  Current Outpatient Medications  Medication Sig Dispense Refill   apixaban (ELIQUIS) 5 MG TABS tablet Take 1 tablet (5 mg total) by mouth 2 (two) times daily. 60 tablet 6   benazepril (LOTENSIN) 40 MG tablet Take 20 mg by mouth daily.     calcium carbonate (OS-CAL) 600 MG TABS tablet Take 600 mg by mouth daily.      chlorthalidone (HYGROTON) 25 MG tablet Take 25 mg by mouth daily.      Cholecalciferol (VITAMIN D-3) 1000 UNITS CAPS Take 1,000 Units by mouth daily.      fenofibrate 160 MG tablet Take 160 mg by mouth daily.     LANTUS SOLOSTAR 100 UNIT/ML Solostar Pen Inject 12 Units into the skin daily after breakfast.     magnesium oxide (MAG-OX) 400 (241.3 Mg) MG tablet      Omega-3 Fatty Acids (FISH OIL) 1000 MG CAPS Take 1,000 mg by mouth daily.      simvastatin (ZOCOR) 20 MG tablet Take 1 tablet (20 mg total) by mouth daily. 30 tablet 2   sodium bicarbonate 650 MG tablet Take 650 mg by mouth daily.     tiZANidine (ZANAFLEX) 4 MG tablet Take 4 mg by mouth at bedtime.     nicotine (NICODERM CQ - DOSED IN MG/24 HOURS) 21 mg/24hr patch Place 1 patch (21 mg total) onto the skin daily. (Patient not taking: Reported on 11/14/2021) 28 patch 0   No current facility-administered medications for this visit.    PHYSICAL EXAM Vitals:   11/14/21 1047  BP: (!) 148/74  Pulse: 65  Resp: 20  Temp: 98.8 F (37.1 C)  SpO2: 97%  Weight: 192 lb (87.1 kg)  Height: 6\' 1"  (1.854 m)    Constitutional: chronically ill appearing. no distress. Appears well nourished.  Neurologic: CN intact. no focal findings. no sensory loss. Psychiatric:  Mood and affect symmetric and appropriate. Eyes:  No icterus. No conjunctival pallor. Ears, nose, throat:  mucous membranes moist. Midline trachea.  Cardiac: regular rate and rhythm.  Respiratory:  unlabored. Abdominal:  soft, non-tender, non-distended.  Peripheral vascular: 2+ radial pulses Extremity: no edema. no cyanosis. no pallor.  Skin: no  gangrene. no ulceration.  Lymphatic: no Stemmer's sign. no palpable lymphadenopathy.  PERTINENT LABORATORY AND RADIOLOGIC DATA  Most recent CBC CBC Latest Ref Rng & Units 06/08/2021 06/07/2021 06/17/2015  WBC 4.0 - 10.5 K/uL 7.0 7.1 15.8(H)  Hemoglobin 13.0 - 17.0 g/dL 12.2(L) 12.1(L) 10.2(L)  Hematocrit 39.0 - 52.0 % 36.1(L) 36.0(L) 29.2(L)  Platelets 150 - 400 K/uL 183 190 180     Most recent CMP CMP Latest Ref Rng & Units 10/16/2021 06/09/2021 06/08/2021  Glucose 70 - 99 mg/dL - 128(H) 185(H)  BUN 8 - 27 mg/dL 35(H) 20 26(H)  Creatinine 0.76 - 1.27 mg/dL 1.94(H) 1.72(H) 1.83(H)  Sodium 135 - 145 mmol/L - 136 139  Potassium 3.5 - 5.1 mmol/L - 4.3 3.9  Chloride 98 - 111 mmol/L - 106 108  CO2 22 - 32 mmol/L - 23 22  Calcium 8.9 - 10.3 mg/dL - 8.8(L) 9.4  Total Protein 6.5 - 8.1 g/dL - - -  Total Bilirubin 0.3 - 1.2 mg/dL - - -  Alkaline Phos 38 - 126 U/L - - -  AST 15 - 41 U/L - - -  ALT 0 - 44 U/L - - -    Renal function CrCl cannot be calculated (Patient's most recent lab result is older than the maximum 21 days allowed.).  Hgb A1c MFr Bld (%)  Date Value  06/07/2021 7.2 (H)    LDL Cholesterol  Date Value Ref Range Status  06/09/2021 112 (H) 0 - 99 mg/dL Final    Comment:           Total Cholesterol/HDL:CHD Risk Coronary Heart Disease Risk Table                     Men   Women  1/2 Average Risk   3.4   3.3  Average Risk       5.0   4.4  2 X Average Risk   9.6   7.1  3 X Average Risk  23.4   11.0        Use the calculated Patient Ratio above and the CHD Risk Table to determine the patient's CHD Risk.        ATP III CLASSIFICATION (LDL):  <100     mg/dL   Optimal  100-129  mg/dL   Near or Above                    Optimal  130-159  mg/dL   Borderline  160-189  mg/dL   High  >190     mg/dL   Very High Performed at [address] [place], [place] [address]      CT angiogram head and neck reviewed this personally.  I disagree with the attending  radiologist read.  I think he has a focal plaque which is significant in his carotid and causing turbulent flow.  This could easily explain transient ischemic attacks.  The stenosis is at least 50% or more based on NASCET criteria.  Yevonne Aline. Stanford Breed, MD [facility] Phone Number: [phone] 11/14/2021 3:31 PM  Total time spent on preparing this encounter including chart review, data review, collecting history, examining the patient, coordinating care for this established patient, 40 minutes.  Portions of this report may have been transcribed using voice recognition software.  Every effort has been made to ensure accuracy; however, inadvertent computerized transcription errors may still be present.

## 2021-11-14 ENCOUNTER — Other Ambulatory Visit: Payer: Self-pay

## 2021-11-14 ENCOUNTER — Encounter: Payer: Self-pay | Admitting: Vascular Surgery

## 2021-11-14 ENCOUNTER — Ambulatory Visit: Payer: Medicare FFS | Admitting: Vascular Surgery

## 2021-11-14 VITALS — BP 148/74 | HR 65 | Temp 98.8°F | Resp 20 | Ht 73.0 in | Wt 192.0 lb

## 2021-11-14 DIAGNOSIS — I6521 Occlusion and stenosis of right carotid artery: Secondary | ICD-10-CM

## 2021-11-16 ENCOUNTER — Encounter (HOSPITAL_COMMUNITY): Payer: Self-pay | Admitting: Radiology

## 2021-11-17 ENCOUNTER — Encounter (HOSPITAL_COMMUNITY): Payer: Self-pay | Admitting: Radiology

## 2021-11-17 NOTE — Pre-Procedure Instructions (Signed)
Surgical Instructions    Your procedure is scheduled on Thursday, March 2nd.  Report to [facility] Main Entrance "A" at 07:45 A.M., then check in with the Admitting office.  Call this number if you have problems the morning of surgery:  [phone]   If you have any questions prior to your surgery date call [phone]: Open Monday-Friday 8am-4pm    Remember:  Do not eat or drink after midnight the night before your surgery     Take these medicines the morning of surgery with A SIP OF WATER  aspirin EC  clopidogrel (PLAVIX)  fenofibrate metoprolol succinate (TOPROL-XL) pantoprazole (PROTONIX)   As of today, STOP taking any Aleve, Naproxen, Ibuprofen, Motrin, Advil, Goody's, BC's, all herbal medications, fish oil, and all vitamins.   WHAT DO I DO ABOUT MY DIABETES MEDICATION?   3/1)- Take normal dose (12 units) of LANTUS SOLOSTAR in the morning Take 3 units (50%) at night   3/2) DAY OF SURGERY- Take 6 units (50%) of LANTUS SOLOSTAR       HOW TO MANAGE YOUR DIABETES BEFORE AND AFTER SURGERY  Why is it important to control my blood sugar before and after surgery? Improving blood sugar levels before and after surgery helps healing and can limit problems. A way of improving blood sugar control is eating a healthy diet by:  Eating less sugar and carbohydrates  Increasing activity/exercise  Talking with your doctor about reaching your blood sugar goals High blood sugars (greater than 180 mg/dL) can raise your risk of infections and slow your recovery, so you will need to focus on controlling your diabetes during the weeks before surgery. Make sure that the doctor who takes care of your diabetes knows about your planned surgery including the date and location.  How do I manage my blood sugar before surgery? Check your blood sugar at least 4 times a day, starting 2 days before surgery, to make sure that the level is not too high or low.  Check your blood sugar the morning  of your surgery when you wake up and every 2 hours until you get to the Short Stay unit.  If your blood sugar is less than 70 mg/dL, you will need to treat for low blood sugar: Do not take insulin. Treat a low blood sugar (less than 70 mg/dL) with  cup of clear juice (cranberry or apple), 4 glucose tablets, OR glucose gel. Recheck blood sugar in 15 minutes after treatment (to make sure it is greater than 70 mg/dL). If your blood sugar is not greater than 70 mg/dL on recheck, call [phone] for further instructions. Report your blood sugar to the short stay nurse when you get to Short Stay.  If you are admitted to the hospital after surgery: Your blood sugar will be checked by the staff and you will probably be given insulin after surgery (instead of oral diabetes medicines) to make sure you have good blood sugar levels. The goal for blood sugar control after surgery is 80-180 mg/dL.                       Do NOT Smoke (Tobacco/Vaping) for 24 hours prior to your procedure.  If you use a CPAP at night, you may bring your mask/headgear for your overnight stay.   Contacts, glasses, piercing's, hearing aid's, dentures or partials may not be worn into surgery, please bring cases for these belongings.    For patients admitted to the hospital, discharge time will  be determined by your treatment team.   Patients discharged the day of surgery will not be allowed to drive home, and someone needs to stay with them for 24 hours.  NO VISITORS WILL BE ALLOWED IN PRE-OP WHERE PATIENTS ARE PREPPED FOR SURGERY.  ONLY 1 SUPPORT PERSON MAY BE PRESENT IN THE WAITING ROOM WHILE YOU ARE IN SURGERY.  IF YOU ARE TO BE ADMITTED, ONCE YOU ARE IN YOUR ROOM YOU WILL BE ALLOWED TWO (2) VISITORS. (1) VISITOR MAY STAY OVERNIGHT BUT MUST ARRIVE TO THE ROOM BY 8pm.  Minor children may have two parents present. Special consideration for safety and communication needs will be reviewed on a case by case basis.   Special  instructions:   [place]- Preparing For Surgery  Before surgery, you can play an important role. Because skin is not sterile, your skin needs to be as free of germs as possible. You can reduce the number of germs on your skin by washing with CHG (chlorahexidine gluconate) Soap before surgery.  CHG is an antiseptic cleaner which kills germs and bonds with the skin to continue killing germs even after washing.    Oral Hygiene is also important to reduce your risk of infection.  Remember - BRUSH YOUR TEETH THE MORNING OF SURGERY WITH YOUR REGULAR TOOTHPASTE  Please do not use if you have an allergy to CHG or antibacterial soaps. If your skin becomes reddened/irritated stop using the CHG.  Do not shave (including legs and underarms) for at least 48 hours prior to first CHG shower. It is OK to shave your face.  Please follow these instructions carefully.   Shower the NIGHT BEFORE SURGERY and the MORNING OF SURGERY  If you chose to wash your hair, wash your hair first as usual with your normal shampoo.  After you shampoo, rinse your hair and body thoroughly to remove the shampoo.  Use CHG Soap as you would any other liquid soap. You can apply CHG directly to the skin and wash gently with a scrungie or a clean washcloth.   Apply the CHG Soap to your body ONLY FROM THE NECK DOWN.  Do not use on open wounds or open sores. Avoid contact with your eyes, ears, mouth and genitals (private parts). Wash Face and genitals (private parts)  with your normal soap.   Wash thoroughly, paying special attention to the area where your surgery will be performed.  Thoroughly rinse your body with warm water from the neck down.  DO NOT shower/wash with your normal soap after using and rinsing off the CHG Soap.  Pat yourself dry with a CLEAN TOWEL.  Wear CLEAN PAJAMAS to bed the night before surgery  Place CLEAN SHEETS on your bed the night before your surgery  DO NOT SLEEP WITH PETS.   Day of  Surgery: Shower with CHG soap. Do not wear jewelry Do not wear lotions, powders, colognes, or deodorant. Do not shave 48 hours prior to surgery.  Men may shave face and neck. Do not bring valuables to the hospital. [facility] is not responsible for any belongings or valuables. Wear Clean/Comfortable clothing the morning of surgery Remember to brush your teeth WITH YOUR REGULAR TOOTHPASTE.   Please read over the following fact sheets that you were given.   3 days prior to your procedure or After your COVID test   You are not required to quarantine however you are required to wear a well-fitting mask when you are out and around people not in  your household. If your mask becomes wet or soiled, replace with a new one.   Wash your hands often with soap and water for 20 seconds or clean your hands with an alcohol-based hand sanitizer that contains at least 60% alcohol.   Do not share personal items.   Notify your provider:  o if you are in close contact with someone who has COVID  o or if you develop a fever of 100.4 or greater, sneezing, cough, sore throat, shortness of breath or body aches.

## 2021-11-20 ENCOUNTER — Other Ambulatory Visit: Payer: Self-pay

## 2021-11-20 ENCOUNTER — Encounter (HOSPITAL_COMMUNITY)
Admission: RE | Admit: 2021-11-20 | Discharge: 2021-11-20 | Disposition: A | Discharge status: Home / Self Care | Payer: Medicare FFS | Source: Ambulatory Visit | Attending: Vascular Surgery | Admitting: Vascular Surgery

## 2021-11-20 ENCOUNTER — Encounter (HOSPITAL_COMMUNITY): Payer: Self-pay

## 2021-11-20 DIAGNOSIS — E118 Type 2 diabetes mellitus with unspecified complications: Secondary | ICD-10-CM | POA: Insufficient documentation

## 2021-11-20 DIAGNOSIS — E785 Hyperlipidemia, unspecified: Secondary | ICD-10-CM

## 2021-11-20 DIAGNOSIS — Z01812 Encounter for preprocedural laboratory examination: Secondary | ICD-10-CM | POA: Insufficient documentation

## 2021-11-20 DIAGNOSIS — I6521 Occlusion and stenosis of right carotid artery: Secondary | ICD-10-CM | POA: Insufficient documentation

## 2021-11-20 DIAGNOSIS — E1169 Type 2 diabetes mellitus with other specified complication: Secondary | ICD-10-CM

## 2021-11-20 DIAGNOSIS — Z7901 Long term (current) use of anticoagulants: Secondary | ICD-10-CM | POA: Insufficient documentation

## 2021-11-20 DIAGNOSIS — I480 Paroxysmal atrial fibrillation: Secondary | ICD-10-CM | POA: Insufficient documentation

## 2021-11-20 DIAGNOSIS — Z20822 Contact with and (suspected) exposure to covid-19: Secondary | ICD-10-CM | POA: Insufficient documentation

## 2021-11-20 DIAGNOSIS — Z8673 Personal history of transient ischemic attack (TIA), and cerebral infarction without residual deficits: Secondary | ICD-10-CM | POA: Insufficient documentation

## 2021-11-20 DIAGNOSIS — N183 Chronic kidney disease, stage 3 unspecified: Secondary | ICD-10-CM | POA: Insufficient documentation

## 2021-11-20 DIAGNOSIS — I6522 Occlusion and stenosis of left carotid artery: Secondary | ICD-10-CM | POA: Insufficient documentation

## 2021-11-20 DIAGNOSIS — Z01818 Encounter for other preprocedural examination: Secondary | ICD-10-CM

## 2021-11-20 LAB — CBC
HCT: 39.7 % (ref 39.0–52.0)
Hemoglobin: 13.1 g/dL (ref 13.0–17.0)
MCH: 31.1 pg (ref 26.0–34.0)
MCHC: 33 g/dL (ref 30.0–36.0)
MCV: 94.3 fL (ref 80.0–100.0)
Platelets: 186 10*3/uL (ref 150–400)
RBC: 4.21 MIL/uL — ABNORMAL LOW (ref 4.22–5.81)
RDW: 12.4 % (ref 11.5–15.5)
WBC: 9.4 10*3/uL (ref 4.0–10.5)
nRBC: 0 % (ref 0.0–0.2)

## 2021-11-20 LAB — APTT: aPTT: 28 seconds (ref 24–36)

## 2021-11-20 LAB — URINALYSIS, ROUTINE W REFLEX MICROSCOPIC
Bilirubin Urine: NEGATIVE
Glucose, UA: NEGATIVE mg/dL
Hgb urine dipstick: NEGATIVE
Ketones, ur: NEGATIVE mg/dL
Leukocytes,Ua: NEGATIVE
Nitrite: NEGATIVE
Protein, ur: 100 mg/dL — AB
Specific Gravity, Urine: 1.015 (ref 1.005–1.030)
pH: 6 (ref 5.0–8.0)

## 2021-11-20 LAB — COMPREHENSIVE METABOLIC PANEL
ALT: 20 U/L (ref 0–44)
AST: 24 U/L (ref 15–41)
Albumin: 4.2 g/dL (ref 3.5–5.0)
Alkaline Phosphatase: 70 U/L (ref 38–126)
Anion gap: 13 (ref 5–15)
BUN: 29 mg/dL — ABNORMAL HIGH (ref 8–23)
CO2: 21 mmol/L — ABNORMAL LOW (ref 22–32)
Calcium: 9.9 mg/dL (ref 8.9–10.3)
Chloride: 98 mmol/L (ref 98–111)
Creatinine, Ser: 2.03 mg/dL — ABNORMAL HIGH (ref 0.61–1.24)
GFR, Estimated: 34 mL/min — ABNORMAL LOW (ref >60)
Glucose, Bld: 176 mg/dL — ABNORMAL HIGH (ref 70–99)
Potassium: 4.1 mmol/L (ref 3.5–5.1)
Sodium: 132 mmol/L — ABNORMAL LOW (ref 135–145)
Total Bilirubin: 0.9 mg/dL (ref 0.3–1.2)
Total Protein: 7.6 g/dL (ref 6.5–8.1)

## 2021-11-20 LAB — HEMOGLOBIN A1C
Hgb A1c MFr Bld: 8.6 % — ABNORMAL HIGH (ref 4.8–5.6)
Mean Plasma Glucose: 200.12 mg/dL

## 2021-11-20 LAB — TYPE AND SCREEN
ABO/RH(D): O POS
Antibody Screen: NEGATIVE

## 2021-11-20 LAB — SURGICAL PCR SCREEN
MRSA, PCR: NEGATIVE
Staphylococcus aureus: NEGATIVE

## 2021-11-20 LAB — PROTIME-INR
INR: 1 (ref 0.8–1.2)
Prothrombin Time: 12.7 seconds (ref 11.4–15.2)

## 2021-11-20 LAB — SARS CORONAVIRUS 2 BY RT PCR (HOSPITAL ORDER, PERFORMED IN ~~LOC~~ HOSPITAL LAB): SARS Coronavirus 2: NEGATIVE

## 2021-11-20 LAB — GLUCOSE, CAPILLARY: Glucose-Capillary: 150 mg/dL — ABNORMAL HIGH (ref 70–99)

## 2021-11-20 NOTE — Progress Notes (Addendum)
PCP - Dr Lia Hopping  Cardiologist - Dr. Gardiner Fanti  Endocrine-none  Pulm-no  Chest x-ray - na  EKG - 08/11/21  Stress Test - 05/31/15  ECHO - 06/08/21  Cardiac Cath - no  Cardiac monitor 05/2021, NSR.   AICD-no PM-no LOOP-no  Nerve Stimulator-no  Dialysis-no  Sleep Study no  CPAP - no  LABS-CBC CMP, PT, PTT, A1C, T/S, UA.  ASA-continue Plavix Continue Eliquis patient stopped on 11/16/20, was supposed to stop it 3 days prior to surgery.  ERAS-no  HA1C- 11/20/21- 8.6 Fasting Blood Sugar - 150 Checks Blood Sugar ___150__ times a day  Anesthesia- Mr. Hamblen denies chest pain or shortness of breath.  Patient states that he is just tired. Mr Davie's left foot to ankle was positive for edema, right foot slight edema note. Patient does not think a Dr has seen the foot.  I sent Ronette Deter, RN to [facility] a staff message informing her that patient stopped Eliquis early, foot edema, Sodium of 132 and patient complaining of feeling so tired.  All instructions explained to the pt, with a verbal understanding of the material. Pt agrees to go over the instructions while at home for a better understanding. Pt also instructed to self quarantine after being tested for COVID-19. The opportunity to ask questions was provided.

## 2021-11-20 NOTE — Pre-Procedure Instructions (Addendum)
Surgical Instructions    Your procedure is scheduled on Thursday, March 2nd.  Report to [facility] Main Entrance "A" at 07:45 A.M., then check in with the Admitting office.  Call this number if you have problems the morning of surgery:  [phone]   If you have any questions prior to your surgery date call [phone]: Open Monday-Friday 8am-4pm    Remember:  Do not eat or drink after midnight the night before your surgery     Take these medicines the morning of surgery with A SIP OF WATER  aspirin EC  clopidogrel (PLAVIX)  fenofibrate metoprolol succinate (TOPROL-XL) pantoprazole (PROTONIX)  Follow your surgeon's instructions regarding holding Eliquis, As of today, STOP taking any Aleve, Naproxen, Ibuprofen, Motrin, Advil, Goody's, BC's, all herbal medications, fish oil, and all vitamins.   WHAT DO I DO ABOUT MY DIABETES MEDICATION?   3/1)- Take normal dose (12 units) of LANTUS SOLOSTAR in the morning Take 3 units (50%) at night   3/2) DAY OF SURGERY- Take 6 units (50%) of LANTUS SOLOSTAR if CBG is greater than 70.      HOW TO MANAGE YOUR DIABETES BEFORE AND AFTER SURGERY  Why is it important to control my blood sugar before and after surgery? Improving blood sugar levels before and after surgery helps healing and can limit problems. A way of improving blood sugar control is eating a healthy diet by:  Eating less sugar and carbohydrates  Increasing activity/exercise  Talking with your doctor about reaching your blood sugar goals High blood sugars (greater than 180 mg/dL) can raise your risk of infections and slow your recovery, so you will need to focus on controlling your diabetes during the weeks before surgery. Make sure that the doctor who takes care of your diabetes knows about your planned surgery including the date and location.  How do I manage my blood sugar before surgery? Check your blood sugar at least 4 times a day, starting 2 days before surgery, to  make sure that the level is not too high or low.  Check your blood sugar the morning of your surgery when you wake up and every 2 hours until you get to the Short Stay unit.  If your blood sugar is less than 70 mg/dL, you will need to treat for low blood sugar: Do not take insulin. Treat a low blood sugar (less than 70 mg/dL) with  cup of clear juice (cranberry or apple), 4 glucose tablets, OR glucose gel. Recheck blood sugar in 15 minutes after treatment (to make sure it is greater than 70 mg/dL). If your blood sugar is not greater than 70 mg/dL on recheck, call [phone] for further instructions. Report your blood sugar to the short stay nurse when you get to Short Stay.  If you are admitted to the hospital after surgery: Your blood sugar will be checked by the staff and you will probably be given insulin after surgery (instead of oral diabetes medicines) to make sure you have good blood sugar levels. The goal for blood sugar control after surgery is 80-180 mg/dL.                       Do NOT Smoke (Tobacco/Vaping) for 24 hours prior to your procedure.  If you use a CPAP at night, you may bring your mask/headgear for your overnight stay.   Contacts, glasses, piercing's, hearing aid's, dentures or partials may not be worn into surgery, please bring cases for these belongings.  For patients admitted to the hospital, discharge time will be determined by your treatment team.   Patients discharged the day of surgery will not be allowed to drive home, and someone needs to stay with them for 24 hours.  NO VISITORS WILL BE ALLOWED IN PRE-OP WHERE PATIENTS ARE PREPPED FOR SURGERY.  ONLY 1 SUPPORT PERSON MAY BE PRESENT IN THE WAITING ROOM WHILE YOU ARE IN SURGERY.  IF YOU ARE TO BE ADMITTED, ONCE YOU ARE IN YOUR ROOM YOU WILL BE ALLOWED TWO (2) VISITORS. (1) VISITOR MAY STAY OVERNIGHT BUT MUST ARRIVE TO THE ROOM BY 8pm.  Minor children may have two parents present. Special consideration for  safety and communication needs will be reviewed on a case by case basis.   Special instructions:   [place]- Preparing For Surgery  Before surgery, you can play an important role. Because skin is not sterile, your skin needs to be as free of germs as possible. You can reduce the number of germs on your skin by washing with CHG (chlorahexidine gluconate) Soap before surgery.  CHG is an antiseptic cleaner which kills germs and bonds with the skin to continue killing germs even after washing.    Oral Hygiene is also important to reduce your risk of infection.  Remember - BRUSH YOUR TEETH THE MORNING OF SURGERY WITH YOUR REGULAR TOOTHPASTE  Please do not use if you have an allergy to CHG or antibacterial soaps. If your skin becomes reddened/irritated stop using the CHG.  Do not shave (including legs and underarms) for at least 48 hours prior to first CHG shower. It is OK to shave your face.  Please follow these instructions carefully.   Shower the NIGHT BEFORE SURGERY and the MORNING OF SURGERY  If you chose to wash your hair, wash your hair first as usual with your normal shampoo.  After you shampoo, rinse your hair and body thoroughly to remove the shampoo.  Use CHG Soap as you would any other liquid soap. You can apply CHG directly to the skin and wash gently with a scrungie or a clean washcloth.   Apply the CHG Soap to your body ONLY FROM THE NECK DOWN.  Do not use on open wounds or open sores. Avoid contact with your eyes, ears, mouth and genitals (private parts). Wash Face and genitals (private parts)  with your normal soap.   Wash thoroughly, paying special attention to the area where your surgery will be performed.  Thoroughly rinse your body with warm water from the neck down.  DO NOT shower/wash with your normal soap after using and rinsing off the CHG Soap.  Pat yourself dry with a CLEAN TOWEL.  Wear CLEAN PAJAMAS to bed the night before surgery  Place CLEAN SHEETS on  your bed the night before your surgery  DO NOT SLEEP WITH PETS.   Day of Surgery: Shower with CHG soap. Do not wear jewelry Do not wear lotions, powders, colognes, or deodorant. Do not shave 48 hours prior to surgery.  Men may shave face and neck. Do not bring valuables to the hospital. [facility] is not responsible for any belongings or valuables. Wear Clean/Comfortable clothing the morning of surgery Remember to brush your teeth WITH YOUR REGULAR TOOTHPASTE.   Please read over the following fact sheets that you were given.   3 days prior to your procedure or After your COVID test   You are not required to quarantine however you are required to wear a well-fitting mask  when you are out and around people not in your household. If your mask becomes wet or soiled, replace with a new one.   Wash your hands often with soap and water for 20 seconds or clean your hands with an alcohol-based hand sanitizer that contains at least 60% alcohol.   Do not share personal items.   Notify your provider:  o if you are in close contact with someone who has COVID  o or if you develop a fever of 100.4 or greater, sneezing, cough, sore throat, shortness of breath or body aches.

## 2021-11-21 ENCOUNTER — Telehealth: Payer: Self-pay

## 2021-11-21 NOTE — Telephone Encounter (Signed)
Message received from preadmission nurse, Bruna Potter reporting Mr. Jon Velazquez stopped Eliquis on 2/23, son stated that they were told to stop it as soon as possible. He is continuing on ASA and Plavix.   Mr. Jon Velazquez was complaining of just being tired, Na was 132 and unsure if the cause. Patient 's left foot was positive edema up to his ankle and right foot had slight edema.   Informed nurse, Mr. Jon Velazquez was correct, as Dr. Stanford Breed had advised him to stop taking Eliquis right away instead of 3 days prior due to starting him on Plavix for his TCAR surgery.   Spoke with patient/son- he reports always having foot swelling. Denies any pain, discoloration, chest pain or shortness of breath. He also reports always feeling tired and weak since his first stroke. Dr. Stanford Breed was made aware.

## 2021-11-21 NOTE — Anesthesia Preprocedure Evaluation (Addendum)
Anesthesia Evaluation  Patient identified by MRN, date of birth, ID band Patient awake    Reviewed: Allergy & Precautions, NPO status , Patient's Chart, lab work & pertinent test results  Airway Mallampati: II  TM Distance: >3 FB Neck ROM: Full    Dental  (+) Dental Advisory Given   Pulmonary COPD, Current Smoker,    breath sounds clear to auscultation       Cardiovascular hypertension, Pt. on medications and Pt. on home beta blockers + Peripheral Vascular Disease   Rhythm:Regular Rate:Normal     Neuro/Psych CVA    GI/Hepatic Neg liver ROS, GERD  ,  Endo/Other  diabetes, Type 2, Insulin Dependent  Renal/GU negative Renal ROS     Musculoskeletal   Abdominal   Peds  Hematology negative hematology ROS (+)   Anesthesia Other Findings   Reproductive/Obstetrics                            Lab Results  Component Value Date   WBC 9.4 11/20/2021   HGB 13.1 11/20/2021   HCT 39.7 11/20/2021   MCV 94.3 11/20/2021   PLT 186 11/20/2021   Lab Results  Component Value Date   CREATININE 2.03 (H) 11/20/2021   BUN 29 (H) 11/20/2021   NA 132 (L) 11/20/2021   K 4.1 11/20/2021   CL 98 11/20/2021   CO2 21 (L) 11/20/2021    Anesthesia Physical Anesthesia Plan  ASA: 3  Anesthesia Plan: General   Post-op Pain Management: Ofirmev IV (intra-op)* and Minimal or no pain anticipated   Induction: Intravenous  PONV Risk Score and Plan: 1 and Dexamethasone, Ondansetron and Treatment may vary due to age or medical condition  Airway Management Planned: Oral ETT  Additional Equipment: Arterial line  Intra-op Plan:   Post-operative Plan: Extubation in OR  Informed Consent: I have reviewed the patients History and Physical, chart, labs and discussed the procedure including the risks, benefits and alternatives for the proposed anesthesia with the patient or authorized representative who has indicated  his/her understanding and acceptance.     Dental advisory given  Plan Discussed with: CRNA  Anesthesia Plan Comments: (PAT note by Karoline Caldwell, PA-C: Patient is admitted September 2022 for CVA-right parietal ischemic stroke.  Echo showed EF 60 to 65%, moderate concentric LVH, mild to moderate MR.  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    Post discharge he did follow-up outpatient with cardiology.  He wore a 30-day event monitor which showed paroxysmal atrial fibrillation as well as a prolonged 6.8-second pause.  Patient was reportedly sleeping at the time of the pause.  Patient denied palpitations, lightheadedness, presyncope or syncope.  He was taken off metoprolol.  Last seen by [name], NP and noted to be in sinus rhythm at that time with a rate of 78 with EKG showing marked ST abnormality consistent with possible inferior subendocardial injury.  He was advised to continue Eliquis and follow-up with cardiology in 6 months.  Patient seen by vascular surgeon Dr. Stanford Breed 11/14/2021 and TCAR was recommended.  Per note, the case was discussed with patient's PCP Dr. Dagoberto Ligas.  Patient was instructed to stop Eliquis and start DAPT with ASA and Plavix.  IDDM 2, uncontrolled, A1c 8.6 on preop labs.  History of CKD 3, baseline creatinine ~1.9.  Creatinine 2.03 on preop labs.  Preop labs  also notable for mild hyponatremia sodium 132.  Remainder of preop labs unremarkable.  EKG 08/11/2021: NSR.  Rate 78.  Marked ST abnormality, possible inferior subendocardial injury.  Cardiac event monitor 06/14/2021: [employer] monitor reviewed. 30 days analyzed. Predominant rhythm is sinus with heart rate ranging from 46 bpm up to 129 bpm and average heart rate 62 bpm. Paroxysmal atrial fibrillation/flutter noted,, 2% of total rhythm burden. There were 9 pauses greater than 3 seconds duration.  Longest pause was 7.2 seconds. Some of these pauses appear to be post-termination pauses from atrial arrhythmia to sinus.  TTE 06/08/2021: 1. Left ventricular ejection fraction, by estimation, is 60 to 65%. The  left ventricle has normal function. The left ventricle has no regional  wall motion abnormalities. There is moderate concentric left ventricular  hypertrophy. Left ventricular  diastolic parameters are indeterminate. Elevated left ventricular  end-diastolic pressure.  2. Right ventricular systolic function is normal. The right ventricular  size is normal. There is normal pulmonary artery systolic pressure. The  estimated right ventricular systolic pressure is [id number] mmHg.  3. Left atrial size was mildly dilated.  4. Right atrial size was mildly dilated.  5. The mitral valve is degenerative. Mild to moderate mitral valve  regurgitation.  6. The aortic valve is tricuspid. There is mild calcification of the  aortic valve. Aortic valve regurgitation is not visualized. Mild to  moderate aortic valve sclerosis/calcification is present, without any  evidence of aortic stenosis. Aortic valve mean  gradient measures 5.0 mmHg.  7. Aortic dilatation noted. There is mild dilatation of the aortic root,  measuring 42 mm.  8. The inferior vena cava is normal in size with greater than 50%  respiratory variability, suggesting right atrial pressure of 3 mmHg.   Carotid ultrasound 06/07/2021: IMPRESSION: 1. Slightly elevated velocities in the right internal carotid artery with moderate plaque burden in the right carotid bulb suggestive of 50-69% stenosis. 2. 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   )      Anesthesia Quick Evaluation

## 2021-11-21 NOTE — Progress Notes (Addendum)
Anesthesia Chart Review:  Patient is admitted September 2022 for CVA-right parietal ischemic stroke.  Echo showed EF 60 to 65%, moderate concentric LVH, mild to moderate MR.  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    Post discharge he did follow-up outpatient with cardiology.  He wore a 30-day event monitor which showed paroxysmal atrial fibrillation as well as a prolonged 6.8-second pause.  Patient was reportedly sleeping at the time of the pause.  Patient denied palpitations, lightheadedness, presyncope or syncope.  He was taken off metoprolol.  Last seen by Jon July, NP and noted to be in sinus rhythm at that time with a rate of 78 with EKG showing marked ST abnormality consistent with possible inferior subendocardial injury.  He was advised to continue Eliquis and follow-up with cardiology in 6 months.  Patient seen by vascular surgeon Dr. Stanford Velazquez 11/14/2021 and TCAR was recommended.  Per note, the case was discussed with patient's PCP Dr. Dagoberto Velazquez.  Patient was instructed to stop Eliquis and start DAPT with ASA and Plavix.  IDDM 2, uncontrolled, A1c 8.6 on preop labs.  History of CKD 3, baseline creatinine ~1.9.  Creatinine 2.03 on preop labs.  Preop labs also notable for mild hyponatremia sodium 132.  Remainder of preop labs unremarkable.  EKG 08/11/2021: NSR.  Rate 78.  Marked ST abnormality, possible inferior subendocardial injury.  Cardiac event monitor 06/14/2021: [employer] monitor reviewed.  30 days analyzed.  Predominant rhythm is sinus with heart rate ranging from 46 bpm up to 129 bpm and average heart rate 62 bpm.  Paroxysmal atrial fibrillation/flutter noted,, 2% of total rhythm burden.  There were 9 pauses greater than 3 seconds duration.  Longest pause was 7.2 seconds.  Some of these pauses appear to be post-termination pauses from atrial arrhythmia to sinus.  TTE  06/08/2021:  1. Left ventricular ejection fraction, by estimation, is 60 to 65%. The  left ventricle has normal function. The left ventricle has no regional  wall motion abnormalities. There is moderate concentric left ventricular  hypertrophy. Left ventricular  diastolic parameters are indeterminate. Elevated left ventricular  end-diastolic pressure.   2. Right ventricular systolic function is normal. The right ventricular  size is normal. There is normal pulmonary artery systolic pressure. The  estimated right ventricular systolic pressure is [id number] mmHg.   3. Left atrial size was mildly dilated.   4. Right atrial size was mildly dilated.   5. The mitral valve is degenerative. Mild to moderate mitral valve  regurgitation.   6. The aortic valve is tricuspid. There is mild calcification of the  aortic valve. Aortic valve regurgitation is not visualized. Mild to  moderate aortic valve sclerosis/calcification is present, without any  evidence of aortic stenosis. Aortic valve mean   gradient measures 5.0 mmHg.   7. Aortic dilatation noted. There is mild dilatation of the aortic root,  measuring 42 mm.   8. The inferior vena cava is normal in size with greater than 50%  respiratory variability, suggesting right atrial pressure of 3 mmHg.   Carotid ultrasound 06/07/2021: IMPRESSION: 1. Slightly elevated velocities in the right internal carotid artery with moderate plaque burden in the right carotid bulb suggestive of 50-69% stenosis. 2. 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    Jon Velazquez Jon Velazquez Short Stay Velazquez/Anesthesiology Phone [phone] 11/21/2021 3:17 PM

## 2021-11-23 ENCOUNTER — Encounter (HOSPITAL_COMMUNITY): Payer: Self-pay | Admitting: Vascular Surgery

## 2021-11-23 ENCOUNTER — Other Ambulatory Visit: Payer: Self-pay

## 2021-11-23 ENCOUNTER — Inpatient Hospital Stay (HOSPITAL_COMMUNITY): Payer: Medicare FFS | Admitting: Physician Assistant

## 2021-11-23 ENCOUNTER — Encounter (HOSPITAL_COMMUNITY)
Admission: RE | Disposition: A | Discharge status: Home / Self Care | Payer: Self-pay | Source: Home / Self Care | Attending: Vascular Surgery

## 2021-11-23 ENCOUNTER — Inpatient Hospital Stay (HOSPITAL_COMMUNITY): Payer: Medicare FFS | Admitting: Critical Care Medicine

## 2021-11-23 ENCOUNTER — Inpatient Hospital Stay (HOSPITAL_COMMUNITY)
Admission: RE | Admit: 2021-11-23 | Discharge: 2021-11-24 | DRG: 035 | Disposition: A | Discharge status: Home / Self Care | Payer: Medicare FFS | Attending: Vascular Surgery | Admitting: Vascular Surgery

## 2021-11-23 DIAGNOSIS — Z794 Long term (current) use of insulin: Secondary | ICD-10-CM

## 2021-11-23 DIAGNOSIS — E1151 Type 2 diabetes mellitus with diabetic peripheral angiopathy without gangrene: Secondary | ICD-10-CM

## 2021-11-23 DIAGNOSIS — J449 Chronic obstructive pulmonary disease, unspecified: Secondary | ICD-10-CM

## 2021-11-23 DIAGNOSIS — E1169 Type 2 diabetes mellitus with other specified complication: Secondary | ICD-10-CM | POA: Diagnosis present

## 2021-11-23 DIAGNOSIS — K219 Gastro-esophageal reflux disease without esophagitis: Secondary | ICD-10-CM | POA: Diagnosis present

## 2021-11-23 DIAGNOSIS — E785 Hyperlipidemia, unspecified: Secondary | ICD-10-CM | POA: Diagnosis present

## 2021-11-23 DIAGNOSIS — E1122 Type 2 diabetes mellitus with diabetic chronic kidney disease: Secondary | ICD-10-CM | POA: Diagnosis present

## 2021-11-23 DIAGNOSIS — F17210 Nicotine dependence, cigarettes, uncomplicated: Secondary | ICD-10-CM | POA: Diagnosis present

## 2021-11-23 DIAGNOSIS — Z9689 Presence of other specified functional implants: Secondary | ICD-10-CM | POA: Diagnosis present

## 2021-11-23 DIAGNOSIS — E871 Hypo-osmolality and hyponatremia: Secondary | ICD-10-CM | POA: Diagnosis present

## 2021-11-23 DIAGNOSIS — I63231 Cerebral infarction due to unspecified occlusion or stenosis of right carotid arteries: Secondary | ICD-10-CM | POA: Diagnosis not present

## 2021-11-23 DIAGNOSIS — Z79899 Other long term (current) drug therapy: Secondary | ICD-10-CM | POA: Diagnosis not present

## 2021-11-23 DIAGNOSIS — Z981 Arthrodesis status: Secondary | ICD-10-CM | POA: Diagnosis not present

## 2021-11-23 DIAGNOSIS — Z006 Encounter for examination for normal comparison and control in clinical research program: Secondary | ICD-10-CM | POA: Diagnosis not present

## 2021-11-23 DIAGNOSIS — Z20822 Contact with and (suspected) exposure to covid-19: Secondary | ICD-10-CM | POA: Diagnosis present

## 2021-11-23 DIAGNOSIS — N183 Chronic kidney disease, stage 3 unspecified: Secondary | ICD-10-CM | POA: Diagnosis present

## 2021-11-23 DIAGNOSIS — Z8673 Personal history of transient ischemic attack (TIA), and cerebral infarction without residual deficits: Secondary | ICD-10-CM

## 2021-11-23 DIAGNOSIS — I129 Hypertensive chronic kidney disease with stage 1 through stage 4 chronic kidney disease, or unspecified chronic kidney disease: Secondary | ICD-10-CM | POA: Diagnosis present

## 2021-11-23 DIAGNOSIS — Z7901 Long term (current) use of anticoagulants: Secondary | ICD-10-CM

## 2021-11-23 DIAGNOSIS — I6521 Occlusion and stenosis of right carotid artery: Secondary | ICD-10-CM | POA: Diagnosis present

## 2021-11-23 DIAGNOSIS — I10 Essential (primary) hypertension: Secondary | ICD-10-CM

## 2021-11-23 HISTORY — PX: ULTRASOUND GUIDANCE FOR VASCULAR ACCESS: SHX6516

## 2021-11-23 HISTORY — PX: TRANSCAROTID ARTERY REVASCULARIZATIONÂ: SHX6778

## 2021-11-23 LAB — GLUCOSE, CAPILLARY
Glucose-Capillary: 120 mg/dL — ABNORMAL HIGH (ref 70–99)
Glucose-Capillary: 104 mg/dL — ABNORMAL HIGH (ref 70–99)
Glucose-Capillary: 170 mg/dL — ABNORMAL HIGH (ref 70–99)
Glucose-Capillary: 175 mg/dL — ABNORMAL HIGH (ref 70–99)

## 2021-11-23 SURGERY — TRANSCAROTID ARTERY REVASCULARIZATION (TCAR)
Anesthesia: General | Site: Neck | Laterality: Right

## 2021-11-23 MED ORDER — LABETALOL HCL 5 MG/ML IV SOLN
10.0000 mg | INTRAVENOUS | Status: AC | PRN
  Administered 2021-11-23 (×2): 10 mg via INTRAVENOUS

## 2021-11-23 MED ORDER — ORAL CARE MOUTH RINSE: 15.0000 mL | Freq: Once | OROMUCOSAL | Status: AC

## 2021-11-23 MED ORDER — PHENOL 1.4 % MT LIQD: 1.0000 | OROMUCOSAL | Status: DC | PRN

## 2021-11-23 MED ORDER — DOCUSATE SODIUM 100 MG PO CAPS
100.0000 mg | ORAL_CAPSULE | Freq: Every day | ORAL | Status: DC
  Administered 2021-11-24: 100 mg via ORAL
  Filled 2021-11-23: qty 1

## 2021-11-23 MED ORDER — GLYCOPYRROLATE PF 0.2 MG/ML IJ SOSY
PREFILLED_SYRINGE | INTRAMUSCULAR | Status: AC
  Filled 2021-11-23: qty 2

## 2021-11-23 MED ORDER — SODIUM CHLORIDE 0.9 % IV SOLN: 500.0000 mL | Freq: Once | INTRAVENOUS | Status: DC | PRN

## 2021-11-23 MED ORDER — SODIUM CHLORIDE 0.9 % IV SOLN: INTRAVENOUS | Status: DC

## 2021-11-23 MED ORDER — MAGNESIUM SULFATE 2 GM/50ML IV SOLN: 2.0000 g | Freq: Every day | INTRAVENOUS | Status: DC | PRN

## 2021-11-23 MED ORDER — CEFAZOLIN SODIUM-DEXTROSE 2-4 GM/100ML-% IV SOLN
2.0000 g | INTRAVENOUS | Status: AC
  Administered 2021-11-23: 2 g via INTRAVENOUS
  Filled 2021-11-23: qty 100

## 2021-11-23 MED ORDER — AMISULPRIDE (ANTIEMETIC) 5 MG/2ML IV SOLN: 10.0000 mg | Freq: Once | INTRAVENOUS | Status: DC | PRN

## 2021-11-23 MED ORDER — IODIXANOL 320 MG/ML IV SOLN
INTRAVENOUS | Status: DC | PRN
  Administered 2021-11-23: 15 mL via INTRA_ARTERIAL

## 2021-11-23 MED ORDER — DEXAMETHASONE SODIUM PHOSPHATE 10 MG/ML IJ SOLN
INTRAMUSCULAR | Status: DC | PRN
  Administered 2021-11-23: 10 mg via INTRAVENOUS

## 2021-11-23 MED ORDER — LABETALOL HCL 5 MG/ML IV SOLN
INTRAVENOUS | Status: DC | PRN
  Administered 2021-11-23: 10 mg via INTRAVENOUS

## 2021-11-23 MED ORDER — FENTANYL CITRATE (PF) 100 MCG/2ML IJ SOLN: 25.0000 ug | INTRAMUSCULAR | Status: DC | PRN

## 2021-11-23 MED ORDER — LABETALOL HCL 5 MG/ML IV SOLN
INTRAVENOUS | Status: AC
  Filled 2021-11-23: qty 4

## 2021-11-23 MED ORDER — HYDRALAZINE HCL 20 MG/ML IJ SOLN
INTRAMUSCULAR | Status: AC
Start: 2021-11-23 — End: 2021-11-24
  Filled 2021-11-23: qty 1

## 2021-11-23 MED ORDER — INSULIN ASPART 100 UNIT/ML IJ SOLN
0.0000 [IU] | Freq: Three times a day (TID) | INTRAMUSCULAR | Status: DC
  Administered 2021-11-23: 3 [IU] via SUBCUTANEOUS
  Administered 2021-11-24: 2 [IU] via SUBCUTANEOUS

## 2021-11-23 MED ORDER — GLYCOPYRROLATE 0.2 MG/ML IJ SOLN
INTRAMUSCULAR | Status: DC | PRN
  Administered 2021-11-23 (×2): .2 mg via INTRAVENOUS

## 2021-11-23 MED ORDER — INSULIN ASPART 100 UNIT/ML IJ SOLN: 0.0000 [IU] | INTRAMUSCULAR | Status: DC | PRN

## 2021-11-23 MED ORDER — LIDOCAINE 2% (20 MG/ML) 5 ML SYRINGE
INTRAMUSCULAR | Status: DC | PRN
  Administered 2021-11-23: 60 mg via INTRAVENOUS

## 2021-11-23 MED ORDER — CEFAZOLIN SODIUM-DEXTROSE 2-4 GM/100ML-% IV SOLN
2.0000 g | Freq: Three times a day (TID) | INTRAVENOUS | Status: AC
  Administered 2021-11-23 – 2021-11-24 (×2): 2 g via INTRAVENOUS
  Filled 2021-11-23 (×2): qty 100

## 2021-11-23 MED ORDER — CHLORHEXIDINE GLUCONATE 0.12 % MT SOLN
15.0000 mL | Freq: Once | OROMUCOSAL | Status: AC
  Administered 2021-11-23: 15 mL via OROMUCOSAL
  Filled 2021-11-23: qty 15

## 2021-11-23 MED ORDER — 0.9 % SODIUM CHLORIDE (POUR BTL) OPTIME
TOPICAL | Status: DC | PRN
  Administered 2021-11-23: 1000 mL

## 2021-11-23 MED ORDER — CHLORHEXIDINE GLUCONATE CLOTH 2 % EX PADS: 6.0000 | MEDICATED_PAD | Freq: Once | CUTANEOUS | Status: DC

## 2021-11-23 MED ORDER — ROCURONIUM BROMIDE 10 MG/ML (PF) SYRINGE
PREFILLED_SYRINGE | INTRAVENOUS | Status: DC | PRN
  Administered 2021-11-23: 50 mg via INTRAVENOUS
  Administered 2021-11-23: 20 mg via INTRAVENOUS

## 2021-11-23 MED ORDER — PHENYLEPHRINE 40 MCG/ML (10ML) SYRINGE FOR IV PUSH (FOR BLOOD PRESSURE SUPPORT)
PREFILLED_SYRINGE | INTRAVENOUS | Status: AC
  Filled 2021-11-23: qty 10

## 2021-11-23 MED ORDER — ALUM & MAG HYDROXIDE-SIMETH 200-200-20 MG/5ML PO SUSP: 15.0000 mL | ORAL | Status: DC | PRN

## 2021-11-23 MED ORDER — HEMOSTATIC AGENTS (NO CHARGE) OPTIME
TOPICAL | Status: DC | PRN
  Administered 2021-11-23: 1 via TOPICAL

## 2021-11-23 MED ORDER — PROPOFOL 10 MG/ML IV BOLUS
INTRAVENOUS | Status: DC | PRN
  Administered 2021-11-23: 140 mg via INTRAVENOUS

## 2021-11-23 MED ORDER — HEPARIN SODIUM (PORCINE) 1000 UNIT/ML IJ SOLN
INTRAMUSCULAR | Status: AC
  Filled 2021-11-23: qty 20

## 2021-11-23 MED ORDER — PROTAMINE SULFATE 10 MG/ML IV SOLN
INTRAVENOUS | Status: AC
  Filled 2021-11-23: qty 10

## 2021-11-23 MED ORDER — PANTOPRAZOLE SODIUM 20 MG PO TBEC
20.0000 mg | DELAYED_RELEASE_TABLET | Freq: Every day | ORAL | Status: DC
Start: 2021-11-24 — End: 2021-11-24
  Administered 2021-11-24: 20 mg via ORAL
  Filled 2021-11-23: qty 1

## 2021-11-23 MED ORDER — EPHEDRINE SULFATE-NACL 50-0.9 MG/10ML-% IV SOSY
PREFILLED_SYRINGE | INTRAVENOUS | Status: DC | PRN
  Administered 2021-11-23 (×2): 5 mg via INTRAVENOUS

## 2021-11-23 MED ORDER — LIDOCAINE 2% (20 MG/ML) 5 ML SYRINGE
INTRAMUSCULAR | Status: AC
  Filled 2021-11-23: qty 5

## 2021-11-23 MED ORDER — HEPARIN SODIUM (PORCINE) 1000 UNIT/ML IJ SOLN
INTRAMUSCULAR | Status: DC | PRN
  Administered 2021-11-23: 2000 [IU] via INTRAVENOUS
  Administered 2021-11-23: 1000 [IU] via INTRAVENOUS
  Administered 2021-11-23: 9000 [IU] via INTRAVENOUS

## 2021-11-23 MED ORDER — ONDANSETRON HCL 4 MG/2ML IJ SOLN: 4.0000 mg | Freq: Four times a day (QID) | INTRAMUSCULAR | Status: DC | PRN

## 2021-11-23 MED ORDER — HYDRALAZINE HCL 20 MG/ML IJ SOLN
10.0000 mg | Freq: Once | INTRAMUSCULAR | Status: AC
  Administered 2021-11-23: 10 mg via INTRAVENOUS

## 2021-11-23 MED ORDER — METOPROLOL SUCCINATE ER 25 MG PO TB24
25.0000 mg | ORAL_TABLET | Freq: Every day | ORAL | Status: DC
Start: 2021-11-24 — End: 2021-11-24
  Administered 2021-11-24: 25 mg via ORAL
  Filled 2021-11-23: qty 1

## 2021-11-23 MED ORDER — SODIUM BICARBONATE 650 MG PO TABS
650.0000 mg | ORAL_TABLET | Freq: Every day | ORAL | Status: DC
  Administered 2021-11-24: 650 mg via ORAL
  Filled 2021-11-23: qty 1

## 2021-11-23 MED ORDER — OXYCODONE HCL 5 MG PO TABS: 5.0000 mg | ORAL_TABLET | ORAL | Status: DC | PRN

## 2021-11-23 MED ORDER — POTASSIUM CHLORIDE CRYS ER 20 MEQ PO TBCR: 20.0000 meq | EXTENDED_RELEASE_TABLET | Freq: Every day | ORAL | Status: DC | PRN

## 2021-11-23 MED ORDER — SUGAMMADEX SODIUM 200 MG/2ML IV SOLN
INTRAVENOUS | Status: DC | PRN
  Administered 2021-11-23: 200 mg via INTRAVENOUS

## 2021-11-23 MED ORDER — PROTAMINE SULFATE 10 MG/ML IV SOLN
INTRAVENOUS | Status: DC | PRN
  Administered 2021-11-23: 50 mg via INTRAVENOUS

## 2021-11-23 MED ORDER — ACETAMINOPHEN 650 MG RE SUPP: 325.0000 mg | RECTAL | Status: DC | PRN

## 2021-11-23 MED ORDER — HYDRALAZINE HCL 20 MG/ML IJ SOLN: 5.0000 mg | INTRAMUSCULAR | Status: DC | PRN

## 2021-11-23 MED ORDER — ASPIRIN EC 81 MG PO TBEC
81.0000 mg | DELAYED_RELEASE_TABLET | Freq: Every day | ORAL | Status: DC
Start: 2021-11-24 — End: 2021-11-24
  Administered 2021-11-24: 81 mg via ORAL
  Filled 2021-11-23: qty 1

## 2021-11-23 MED ORDER — ONDANSETRON HCL 4 MG/2ML IJ SOLN
INTRAMUSCULAR | Status: DC | PRN
  Administered 2021-11-23: 4 mg via INTRAVENOUS

## 2021-11-23 MED ORDER — ESMOLOL HCL 100 MG/10ML IV SOLN
INTRAVENOUS | Status: DC | PRN
  Administered 2021-11-23 (×3): 30 mg via INTRAVENOUS
  Administered 2021-11-23: 10 mg via INTRAVENOUS

## 2021-11-23 MED ORDER — POLYETHYLENE GLYCOL 3350 17 G PO PACK: 17.0000 g | PACK | Freq: Every day | ORAL | Status: DC | PRN

## 2021-11-23 MED ORDER — CLOPIDOGREL BISULFATE 75 MG PO TABS
75.0000 mg | ORAL_TABLET | Freq: Every day | ORAL | Status: DC
Start: 2021-11-24 — End: 2021-11-24
  Administered 2021-11-24: 75 mg via ORAL
  Filled 2021-11-23: qty 1

## 2021-11-23 MED ORDER — HEPARIN 6000 UNIT IRRIGATION SOLUTION
Status: DC | PRN
  Administered 2021-11-23: 1

## 2021-11-23 MED ORDER — PHENYLEPHRINE HCL-NACL 20-0.9 MG/250ML-% IV SOLN
INTRAVENOUS | Status: DC | PRN
Start: 2021-11-23 — End: 2021-11-23
  Administered 2021-11-23: 20 ug/min via INTRAVENOUS

## 2021-11-23 MED ORDER — LABETALOL HCL 5 MG/ML IV SOLN: 10.0000 mg | INTRAVENOUS | Status: DC | PRN

## 2021-11-23 MED ORDER — PHENYLEPHRINE 40 MCG/ML (10ML) SYRINGE FOR IV PUSH (FOR BLOOD PRESSURE SUPPORT)
PREFILLED_SYRINGE | INTRAVENOUS | Status: DC | PRN
  Administered 2021-11-23: 80 ug via INTRAVENOUS
  Administered 2021-11-23: 40 ug via INTRAVENOUS
  Administered 2021-11-23 (×2): 80 ug via INTRAVENOUS
  Administered 2021-11-23: 40 ug via INTRAVENOUS
  Administered 2021-11-23: 80 ug via INTRAVENOUS

## 2021-11-23 MED ORDER — PROPOFOL 10 MG/ML IV BOLUS
INTRAVENOUS | Status: AC
  Filled 2021-11-23: qty 20

## 2021-11-23 MED ORDER — SIMVASTATIN 20 MG PO TABS
20.0000 mg | ORAL_TABLET | Freq: Every day | ORAL | Status: DC
  Administered 2021-11-23: 20 mg via ORAL
  Filled 2021-11-23: qty 1

## 2021-11-23 MED ORDER — ONDANSETRON HCL 4 MG/2ML IJ SOLN
INTRAMUSCULAR | Status: AC
  Filled 2021-11-23: qty 2

## 2021-11-23 MED ORDER — ROCURONIUM BROMIDE 10 MG/ML (PF) SYRINGE
PREFILLED_SYRINGE | INTRAVENOUS | Status: AC
  Filled 2021-11-23: qty 10

## 2021-11-23 MED ORDER — CLEVIDIPINE BUTYRATE 0.5 MG/ML IV EMUL
0.0000 mg/h | INTRAVENOUS | Status: DC
  Administered 2021-11-23: 1 mg/h via INTRAVENOUS

## 2021-11-23 MED ORDER — HYDROMORPHONE HCL 1 MG/ML IJ SOLN: 0.5000 mg | INTRAMUSCULAR | Status: DC | PRN

## 2021-11-23 MED ORDER — BISACODYL 10 MG RE SUPP: 10.0000 mg | Freq: Every day | RECTAL | Status: DC | PRN

## 2021-11-23 MED ORDER — METOPROLOL TARTRATE 5 MG/5ML IV SOLN: 2.0000 mg | INTRAVENOUS | Status: DC | PRN

## 2021-11-23 MED ORDER — FENTANYL CITRATE (PF) 250 MCG/5ML IJ SOLN
INTRAMUSCULAR | Status: DC | PRN
  Administered 2021-11-23: 100 ug via INTRAVENOUS

## 2021-11-23 MED ORDER — ACETAMINOPHEN 325 MG PO TABS: 325.0000 mg | ORAL_TABLET | ORAL | Status: DC | PRN

## 2021-11-23 MED ORDER — GUAIFENESIN-DM 100-10 MG/5ML PO SYRP: 15.0000 mL | ORAL_SOLUTION | ORAL | Status: DC | PRN

## 2021-11-23 MED ORDER — FENTANYL CITRATE (PF) 250 MCG/5ML IJ SOLN
INTRAMUSCULAR | Status: AC
  Filled 2021-11-23: qty 5

## 2021-11-23 SURGICAL SUPPLY — 50 items
BAG BANDED W/RUBBER/TAPE 36X54 (MISCELLANEOUS) ×3 IMPLANT
BALLN STERLING RX 5X30X80 (BALLOONS) ×3
BALLOON STERLING RX 5X30X80 (BALLOONS) IMPLANT
CANISTER SUCT 3000ML PPV (MISCELLANEOUS) ×3 IMPLANT
CHLORAPREP W/TINT 26 (MISCELLANEOUS) ×6 IMPLANT
COVER DOME SNAP 22 D (MISCELLANEOUS) ×3 IMPLANT
COVER PROBE W GEL 5X96 (DRAPES) ×3 IMPLANT
DERMABOND ADHESIVE PROPEN (GAUZE/BANDAGES/DRESSINGS) ×1
DERMABOND ADVANCED (GAUZE/BANDAGES/DRESSINGS) ×2
DERMABOND ADVANCED .7 DNX12 (GAUZE/BANDAGES/DRESSINGS) IMPLANT
DERMABOND ADVANCED .7 DNX6 (GAUZE/BANDAGES/DRESSINGS) ×2 IMPLANT
DRAPE FEMORAL ANGIO 80X135IN (DRAPES) ×3 IMPLANT
ELECT REM PT RETURN 9FT ADLT (ELECTROSURGICAL) ×3
ELECTRODE REM PT RTRN 9FT ADLT (ELECTROSURGICAL) ×2 IMPLANT
GAUZE SPONGE 4X4 12PLY STRL (GAUZE/BANDAGES/DRESSINGS) ×3 IMPLANT
GLOVE SURG POLYISO LF SZ8 (GLOVE) ×3 IMPLANT
GOWN STRL REUS W/ TWL LRG LVL3 (GOWN DISPOSABLE) ×4 IMPLANT
GOWN STRL REUS W/ TWL XL LVL3 (GOWN DISPOSABLE) ×2 IMPLANT
GOWN STRL REUS W/TWL LRG LVL3 (GOWN DISPOSABLE) ×2
GOWN STRL REUS W/TWL XL LVL3 (GOWN DISPOSABLE) ×1
GUIDEWIRE ENROUTE 0.014 (WIRE) ×3 IMPLANT
HEMOSTAT SNOW SURGICEL 2X4 (HEMOSTASIS) ×1 IMPLANT
INTRODUCER KIT GALT 7CM (INTRODUCER) ×1
KIT BASIN OR (CUSTOM PROCEDURE TRAY) ×3 IMPLANT
KIT ENCORE 26 ADVANTAGE (KITS) ×3 IMPLANT
KIT INTRODUCER GALT 7 (INTRODUCER) ×2 IMPLANT
KIT TURNOVER KIT B (KITS) ×3 IMPLANT
NDL HYPO 25GX1X1/2 BEV (NEEDLE) IMPLANT
NEEDLE HYPO 25GX1X1/2 BEV (NEEDLE) IMPLANT
NS IRRIG 1000ML POUR BTL (IV SOLUTION) ×3 IMPLANT
PACK CAROTID (CUSTOM PROCEDURE TRAY) ×3 IMPLANT
POSITIONER HEAD DONUT 9IN (MISCELLANEOUS) ×3 IMPLANT
PROTECTION STATION PRESSURIZED (MISCELLANEOUS) ×3
SET MICROPUNCTURE 5F STIFF (MISCELLANEOUS) ×3 IMPLANT
SHUNT CAROTID BYPASS 10 (VASCULAR PRODUCTS) IMPLANT
STATION PROTECTION PRESSURIZED (MISCELLANEOUS) IMPLANT
STENT TRANSCAROTID SYSTEM 9X30 (Permanent Stent) ×1 IMPLANT
SUT MNCRL AB 4-0 PS2 18 (SUTURE) ×3 IMPLANT
SUT PROLENE 5 0 C 1 24 (SUTURE) ×4 IMPLANT
SUT SILK 2 0 SH (SUTURE) ×3 IMPLANT
SUT VIC AB 3-0 SH 27 (SUTURE) ×1
SUT VIC AB 3-0 SH 27X BRD (SUTURE) ×2 IMPLANT
SYR 10ML LL (SYRINGE) ×8 IMPLANT
SYR 20ML LL LF (SYRINGE) ×3 IMPLANT
SYSTEM TRANSCAROTID NEUROPRTCT (MISCELLANEOUS) ×2 IMPLANT
TOWEL GREEN STERILE (TOWEL DISPOSABLE) ×3 IMPLANT
TRANSCAROTID NEUROPROTECT SYS (MISCELLANEOUS) ×3
WATER STERILE IRR 1000ML POUR (IV SOLUTION) ×3 IMPLANT
WIRE BENTSON .035X145CM (WIRE) ×3 IMPLANT
WIRE TORQFLEX AUST .018X40CM (WIRE) ×1 IMPLANT

## 2021-11-23 NOTE — Transfer of Care (Signed)
Immediate Anesthesia Transfer of Care Note ? ?Patient: Jon Velazquez ? ?Procedure(s) Performed: Right Transcarotid Artery Revascularization (Right: Neck) ?ULTRASOUND GUIDANCE FOR VASCULAR ACCESS, LEFT FEMORAL VEIN (Left: Groin) ? ?Patient Location: PACU ? ?Anesthesia Type:General ? ?Level of Consciousness: awake ? ?Airway & Oxygen Therapy: Patient Spontanous Breathing and Patient connected to nasal cannula oxygen ? ?Post-op Assessment: Report given to RN and Post -op Vital signs reviewed and stable ? ?Post vital signs: Reviewed and stable ? ?Last Vitals:  ?Vitals Value Taken Time  ?BP 160/72 11/23/21 1142  ?Temp    ?Pulse 73 11/23/21 1145  ?Resp 17 11/23/21 1145  ?SpO2 99 % 11/23/21 1145  ?Vitals shown include unvalidated device data. ? ?Last Pain:  ?Vitals:  ? 11/23/21 0813  ?TempSrc:   ?PainSc: 0-No pain  ?   ? ?  ? ?Complications: No notable events documented. ?

## 2021-11-23 NOTE — Discharge Instructions (Addendum)
? ?[facility] ? ?Discharge Instructions ?  ?Carotid Surgery ? ?Please refer to the following instructions for your post-procedure care. Your surgeon or physician assistant will discuss any changes with you. ? ?Activity ? ?You are encouraged to walk as much as you can. You can slowly return to normal activities but must avoid strenuous activity and heavy lifting until your doctor tell you it's okay. Avoid activities such as vacuuming or swinging a golf club. You can drive after one week if you are comfortable and you are no longer taking prescription pain medications. It is normal to feel tired for serval weeks after your surgery. It is also normal to have difficulty with sleep habits, eating, and bowel movements after surgery. These will go away with time. ? ?Bathing/Showering ? ?Shower daily after you go home. Do not soak in a bathtub, hot tub, or swim until the incision heals completely. ? ?Incision Care ? ?Shower every day. Clean your incision with mild soap and water. Pat the area dry with a clean towel. You do not need a bandage unless otherwise instructed. Do not apply any ointments or creams to your incision. You may have skin glue on your incision. Do not peel it off. It will come off on its own in about one week. Your incision may feel thickened and raised for several weeks after your surgery. This is normal and the skin will soften over time.  ? ?For Men Only: It's okay to shave around the incision but do not shave the incision itself for 2 weeks. It is common to have numbness under your chin that could last for several months. ? ?Diet ? ?Resume your normal diet. There are no special food restrictions following this procedure. A low fat/low cholesterol diet is recommended for all patients with vascular disease. In order to heal from your surgery, it is CRITICAL to get adequate nutrition. Your body requires vitamins, minerals, and protein. Vegetables are the best source of  vitamins and minerals. Vegetables also provide the perfect balance of protein. Processed food has little nutritional value, so try to avoid this. ? ?Medications ? ?Resume taking all of your medications unless your doctor or physician assistant tells you not to. If your incision is causing pain, you may take over-the- counter pain relievers such as acetaminophen (Tylenol). If you were prescribed a stronger pain medication, please be aware these medications can cause nausea and constipation. Prevent nausea by taking the medication with a snack or meal. Avoid constipation by drinking plenty of fluids and eating foods with a high amount of fiber, such as fruits, vegetables, and grains.  ?Do not take Tylenol if you are taking prescription pain medications. ? ?Stop taking your Eliquis for now.  Take Plavix and aspirin until you see Dr. Stanford Breed in the office in 4 weeks.  At that time, he will most likely stop the Plavix and have you resume your Eliquis.  You will remain on aspirin indefinitely. ? ?Hold taking your Benazepril.  Follow up with your PCP next week to check your labs and review medications. ? ?Follow Up ? ?Our office will schedule a follow up appointment 2-3 weeks following discharge. ? ?Please call us immediately for any of the following conditions ? ?Increased pain, redness, drainage (pus) from your incision site. ?Fever of 101 degrees or higher. ?If you should develop stroke (slurred speech, difficulty swallowing, weakness on one side of your body, loss of vision) you should call 911 and go to the nearest  emergency room. ? ?Reduce your risk of vascular disease: ? ?Stop smoking. If you would like help call [facility] at 1-800-QUIT-NOW 281-763-4691) or [place] at [phone]. ?Manage your cholesterol ?Maintain a desired weight ?Control your diabetes ?Keep your blood pressure down ? ?If you have any questions, please call the office at [phone]. ? ?

## 2021-11-23 NOTE — Anesthesia Procedure Notes (Signed)
Arterial Line Insertion ?Start/End3/10/2021 8:45 AM, 11/23/2021 8:51 AM ?Performed by: Rachel Moulds, CRNA, CRNA ? Patient location: Pre-op. ?Preanesthetic checklist: patient identified, IV checked, site marked, risks and benefits discussed, surgical consent, monitors and equipment checked, pre-op evaluation, timeout performed and anesthesia consent ?Lidocaine 1% used for infiltration ?Right, radial was placed ?Catheter size: 20 G ?Hand hygiene performed  and maximum sterile barriers used  ?Allen's test indicative of satisfactory collateral circulation ?Attempts: 1 ?Procedure performed without using ultrasound guided technique. ?Following insertion, dressing applied and Biopatch. ?Patient tolerated the procedure well with no immediate complications. ? ? ?

## 2021-11-23 NOTE — Interval H&P Note (Signed)
History and Physical Interval Note: ? ?11/23/2021 ?9:48 AM ? ?Jon Velazquez  has presented today for surgery, with the diagnosis of Right carotid stenosis.  The various methods of treatment have been discussed with the patient and family. After consideration of risks, benefits and other options for treatment, the patient has consented to  Procedure(s): ?Right Transcarotid Artery Revascularization (Right) as a surgical intervention.  The patient's history has been reviewed, patient examined, no change in status, stable for surgery.  I have reviewed the patient's chart and labs.  Questions were answered to the patient's satisfaction.   ? ? ?Leonie Douglas ? ? ?

## 2021-11-23 NOTE — Progress Notes (Addendum)
Dr. Deatra Canter was notified about BP being elevated. Also, he was informed that patient had this morning Lantus 15 units (CBG 120). ?

## 2021-11-23 NOTE — Progress Notes (Signed)
?  Day of Surgery Note ? ? ? ?Subjective:  resting comfortably; no complaints ? ? ?Vitals:  ? 11/23/21 1157 11/23/21 1211  ?BP: (!) 144/64 (!) 147/65  ?Pulse: 74 72  ?Resp: 16 15  ?Temp:    ?SpO2: 98% 96%  ? ? ?Incisions:   right neck is clean with small hematoma ?Extremities:  moving all extremities and tongue is midline ?Lungs:  non labored ? ? ? ?Assessment/Plan:  This is a 71 y.o. male who is s/p  ?Right TCAR ? ?-pt doing well in recovery.  He is requiring Cleviprex and hopeful this can be weaned. ?-small hematoma at right neck incision.  Will continue to monitor ? ? ?Doreatha Massed, PA-C ?11/23/2021 ?12:32 PM ?[phone] ? ?

## 2021-11-23 NOTE — Op Note (Signed)
DATE OF SERVICE: 11/23/2021 ? ?PATIENT:  Jon Velazquez  71 y.o. male ? ?PRE-OPERATIVE DIAGNOSIS:  symptomatic right carotid artery stenosis ? ?POST-OPERATIVE DIAGNOSIS:  Same ? ?PROCEDURE:   ?Right transcarotid artery revascularization ? ?SURGEON:  Surgeon(s) and Role: ?   * Cherre Robins, MD - Primary ?   Broadus John, MD - Assisting ? ?ASSISTANT: Cassandria Santee, MD ? ?An experienced assistant was required given the complexity of this procedure and the standard of surgical care. My assistant helped with exposure through counter tension, suctioning, ligation and retraction to better visualize the surgical field.  My assistant expedited sewing during the case by following my sutures. Wherever I use the term "we" in the report, my assistant actively helped me with that portion of the procedure. ? ?ANESTHESIA:   general ? ?EBL: minimal ? ?BLOOD ADMINISTERED:none ? ?DRAINS: none  ? ?LOCAL MEDICATIONS USED:  NONE ? ?SPECIMEN:  none ? ?COUNTS: confirmed correct. ? ?TOURNIQUET:  none ? ?PATIENT DISPOSITION:  PACU - hemodynamically stable. ?  ?Delay start of Pharmacological VTE agent (>24hrs) due to surgical blood loss or risk of bleeding: no ? ?INDICATION FOR PROCEDURE: Jon Velazquez is a 71 y.o. male with symptomatic right carotid stenosis causing TIAs. After careful discussion of risks, benefits, and alternatives the patient was offered TCAR. We specifically discussed risk of stroke, cranial nerve injury and hematoma. The patient understood and wished to proceed. ? ?OPERATIVE FINDINGS: unremarkable TCAR. Good technical result from stenting. Awoke in OR neurologically intact. No change in neuro exam in PACU. ? ?DESCRIPTION OF PROCEDURE: After identification of the patient in the pre-operative holding area, the patient was transferred to the operating room. The patient was positioned supine on the operating room table. Anesthesia was induced. The right neck and groins were prepped and draped in standard fashion.  A surgical pause was performed confirming correct patient, procedure, and operative location. ? ?Using intraoperative ultrasound the course of the right common carotid artery was mapped on the skin.  A transverse incision was made between the sternal and clavicular heads of the sternocleidomastoid muscle, below the omohyoid. Following longitudinal division of the carotid sheath the jugular vein was partially skeletonized and retracted medially. Once 3 cm of common carotid artery (CCA) were isolated, umbilical tape was placed around the proximal 1/3 of the CCA under direct vision. A 5-O Prolene suture was pre-placed in the anterior wall of the CCA, in a ?U stitch? configuration,  ?close to the clavicle to facilitate hemostasis upon removal of the arterial sheath at completion of the TCAR procedure.  ? ?The contralateral common femoral vein (CFV) was accessed under ultrasound guidance, using standard Seldinger and micropuncture access technique. The venous return sheath was advanced into the CFV over the 0.035? wire provided. Blood was aspirated from the flow line followed by flushing of the Venous Sheath with heparinized saline. The Venous Sheath was secured to the patient?s skin with suture to maintain  ?optimal position in the vessel. Heparin was given to obtain a therapeutic activated clotting time >250 seconds prior to arterial access.  ? ?A 4-French non-stiffened ENHANCE? Transcarotid / Peripheral Access set was used, puncturing the artery with the 21G needle through the pre-placed ?U? stitch while holding gentle traction on the umbilical tape to stabilize and centralize the CCA within the incision. Careful attention was paid to the change in CCA shape when using the umbilical tape to control or lift the artery. The micropuncture wire was then advanced 3-4 cm into  the CCA and, the 21G needle was removed. The micropuncture sheath was advanced 2-3 cm into the CCA and the wire and dilator were removed. Pulsatile  backflow indicated correct positioning. The provided 0.035" J-tipped guidewire was inserted as close as possible to the bifurcation without engaging the lesion. After micropuncture sheath removal, the Transcarotid Arterial Sheath was advanced to the 2.5cm marker and the 0.035? wire and dilator were then removed. Arterial Sheath position was assessed under fluoroscopy in two projections to ensure that the sheath tip was oriented coaxially in the CCA. The Arterial Sheath was sutured to the patient with gentle forward tension. Blood was slowly aspirated followed by flushing with heparinized saline. No ingress of air bubbles through the passive hemostatic valve was observed. The stopcocks were closed. Traction applied to the CCA previously to facilitate access was gently released. ? ?The Flow Controller was connected to the Transcarotid Arterial Sheath, prepared by passively allowing a column of arterial blood to fill the line and connected to the Venous Return Sheath. CCA inflow was occluded proximal to the arteriotomy with a vascular clamp to achieve active flow reversal. To confirm flow reversal, a saline bolus was delivered into the venous flow line on both ?High? and ?Low? flow settings of the Flow Controller. Angiograms were performed with slow injections of a small amount of contrast filling just past the lesion to minimize antegrade transmission of micro-bubbles.  ?Prior to lesion manipulation, heart rate (XX123456) and systolic BP (123456) were managed upwards to optimize flow reversal and procedural neuroprotection. The lesion was crossed with an 0.014? ENROUTE? guidewire and pre-dilation of the lesion was performed with a 5x60mm rapid exchange 0.014? compatible balloon catheter to 8 atmospheres for 10 seconds. Stenting was performed with an 9x5mm ENROUTE? Transcarotid stent, sized appropriately to the right CCA.  A total of 6 minutes of flow reversal was used. ? ?AP and lateral angiograms (gentle contrast  injections) were performed to confirm stent placement and arterial wall stent apposition. At [facility] case completion, antegrade flow was restored by releasing the clamp on the CCA then closing the NPS stopcocks to the flow lines. The Transcarotid Arterial Sheath was removed and the pre-closure suture was tied. Heparin reversal was employed and a drain was placed. The Venous Return Sheath was removed and hemostasis was achieved with brief manual compression.  ? ?Upon completion of the case instrument and sharps counts were confirmed correct. The patient was transferred to the PACU in good condition. I was present for all portions of the procedure. ? ?Yevonne Aline. Stanford Breed, MD ?[facility] ?Office Phone Number: [phone] ?11/23/2021 11:43 AM ? ? ? ?

## 2021-11-23 NOTE — Anesthesia Procedure Notes (Addendum)
Procedure Name: Intubation ?Date/Time: 11/23/2021 10:12 AM ?Performed by: Vonna Drafts, CRNA ?Pre-anesthesia Checklist: Patient identified, Emergency Drugs available, Suction available and Patient being monitored ?Patient Re-evaluated:Patient Re-evaluated prior to induction ?Oxygen Delivery Method: Circle system utilized ?Preoxygenation: Pre-oxygenation with 100% oxygen ?Induction Type: IV induction ?Ventilation: Mask ventilation without difficulty and Oral airway inserted - appropriate to patient size ?Laryngoscope Size: Mac and 4 ?Grade View: Grade I ?Tube type: Oral ?Number of attempts: 1 ?Airway Equipment and Method: Stylet and Oral airway ?Placement Confirmation: ETT inserted through vocal cords under direct vision, positive ETCO2 and breath sounds checked- equal and bilateral ?Secured at: 23 cm ?Tube secured with: Tape ?Dental Injury: Teeth and Oropharynx as per pre-operative assessment  ?Comments: One attempt by [employer] [facility] paramedic student. ETT placed by G. Lovena Le CRNA. ? ? ? ? ?

## 2021-11-23 NOTE — Progress Notes (Signed)
11/23/2021 ?1500 ?Received pt to room 4E-23 from PACU.  Pt is A&O,neuro intact, no C/O voiced.  Tele monitor applied and CCMD notified.  CHG bath given.  Oriented to room, call light and bed.  Call bell in reach. ? ?Jon Velazquez C ? ? ? ?

## 2021-11-24 ENCOUNTER — Encounter (HOSPITAL_COMMUNITY): Payer: Self-pay | Admitting: Vascular Surgery

## 2021-11-24 LAB — BASIC METABOLIC PANEL
Anion gap: 9 (ref 5–15)
BUN: 26 mg/dL — ABNORMAL HIGH (ref 8–23)
CO2: 20 mmol/L — ABNORMAL LOW (ref 22–32)
Calcium: 8.6 mg/dL — ABNORMAL LOW (ref 8.9–10.3)
Chloride: 102 mmol/L (ref 98–111)
Creatinine, Ser: 2 mg/dL — ABNORMAL HIGH (ref 0.61–1.24)
GFR, Estimated: 35 mL/min — ABNORMAL LOW (ref >60)
Glucose, Bld: 181 mg/dL — ABNORMAL HIGH (ref 70–99)
Potassium: 4.2 mmol/L (ref 3.5–5.1)
Sodium: 131 mmol/L — ABNORMAL LOW (ref 135–145)

## 2021-11-24 LAB — GLUCOSE, CAPILLARY
Glucose-Capillary: 133 mg/dL — ABNORMAL HIGH (ref 70–99)
Glucose-Capillary: 115 mg/dL — ABNORMAL HIGH (ref 70–99)

## 2021-11-24 LAB — LIPID PANEL
Cholesterol: 194 mg/dL (ref 0–200)
HDL: 30 mg/dL — ABNORMAL LOW (ref >40)
LDL Cholesterol: 130 mg/dL — ABNORMAL HIGH (ref 0–99)
Total CHOL/HDL Ratio: 6.5 RATIO
Triglycerides: 168 mg/dL — ABNORMAL HIGH (ref <150)
VLDL: 34 mg/dL (ref 0–40)

## 2021-11-24 LAB — CBC
HCT: 30.8 % — ABNORMAL LOW (ref 39.0–52.0)
Hemoglobin: 10.5 g/dL — ABNORMAL LOW (ref 13.0–17.0)
MCH: 31.3 pg (ref 26.0–34.0)
MCHC: 34.1 g/dL (ref 30.0–36.0)
MCV: 91.9 fL (ref 80.0–100.0)
Platelets: 133 10*3/uL — ABNORMAL LOW (ref 150–400)
RBC: 3.35 MIL/uL — ABNORMAL LOW (ref 4.22–5.81)
RDW: 12.5 % (ref 11.5–15.5)
WBC: 10.9 10*3/uL — ABNORMAL HIGH (ref 4.0–10.5)
nRBC: 0 % (ref 0.0–0.2)

## 2021-11-24 LAB — POCT ACTIVATED CLOTTING TIME
Activated Clotting Time: 239 seconds
Activated Clotting Time: 251 seconds

## 2021-11-24 MED ORDER — HYDROCODONE-ACETAMINOPHEN 5-325 MG PO TABS: 1.0000 | ORAL_TABLET | Freq: Four times a day (QID) | ORAL | 0 refills | Status: AC | PRN

## 2021-11-24 MED ORDER — ATORVASTATIN CALCIUM 80 MG PO TABS: 80.0000 mg | ORAL_TABLET | Freq: Every day | ORAL | 3 refills | Status: AC

## 2021-11-24 NOTE — Progress Notes (Addendum)
?  Progress Note ? ? ? ?11/24/2021 ?6:46 AM ?1 Day Post-Op ? ?Subjective:  wants to go home; says it will take his son 2.5 hrs to get here from [place], [place].  He states he has walked and voided. Says he has some soreness but not really pain. ? ?Afebrile ?HR 70's-90's ?[id number] systolic ?0000000 RA ? ?Gtts:  none ? ?Vitals:  ? 11/24/21 0022 11/24/21 [id number]  ?BP: (!) 148/76 (!) 124/53  ?Pulse: 86 74  ?Resp: 17 18  ?Temp: 98.3 ?F (36.8 ?C) 98.3 ?F (36.8 ?C)  ?SpO2: 98% 96%  ?  ? ?Physical Exam: ?Neuro:  in tact ?Lungs:  non labored ?Incision:  right neck incision is clean and dry; left groin is soft without hematoma ? ?CBC ?   ?Component Value Date/Time  ? WBC 10.9 (H) 11/24/2021 0159  ? RBC 3.35 (L) 11/24/2021 0159  ? HGB 10.5 (L) 11/24/2021 0159  ? HCT 30.8 (L) 11/24/2021 0159  ? PLT 133 (L) 11/24/2021 0159  ? MCV 91.9 11/24/2021 0159  ? MCH 31.3 11/24/2021 0159  ? MCHC 34.1 11/24/2021 0159  ? RDW 12.5 11/24/2021 0159  ? LYMPHSABS 1.8 06/07/2021 1112  ? MONOABS 0.6 06/07/2021 1112  ? EOSABS 0.2 06/07/2021 1112  ? BASOSABS 0.0 06/07/2021 1112  ? ? ?BMET ?   ?Component Value Date/Time  ? NA 131 (L) 11/24/2021 0159  ? K 4.2 11/24/2021 0159  ? CL 102 11/24/2021 0159  ? CO2 20 (L) 11/24/2021 0159  ? GLUCOSE 181 (H) 11/24/2021 0159  ? BUN 26 (H) 11/24/2021 0159  ? BUN 35 (H) 10/16/2021 1438  ? CREATININE 2.00 (H) 11/24/2021 0159  ? CALCIUM 8.6 (L) 11/24/2021 0159  ? GFRNONAA 35 (L) 11/24/2021 0159  ? GFRAA 45 (L) 06/17/2015 0521  ? ? ? ?Intake/Output Summary (Last 24 hours) at 11/24/2021 0646 ?Last data filed at 11/24/2021 0424 ?Gross per 24 hour  ?Intake 900 ml  ?Output 1425 ml  ?Net -525 ml  ? ? ? Assessment/Plan:  This is a 71 y.o. male who is s/p TCAR 1 Day Post-Op ? ?-pt is doing well this am. ?-creatinine stable from pre op at 2.0.  will hold Benazepril and have him f/u with his PCP for labs next week ?-pt neuro exam is in tact ?-pt has ambulated ?-pt has voided ?-f/u with [facility] in 4 weeks with carotid duplex ?-PDMP  reviewed ? ? ?Leontine Locket, PA-C ?[facility] ?[phone] ? ?VASCULAR STAFF ADDENDUM: ?I have independently interviewed and examined the patient. ?I agree with the above.  ? ?Yevonne Aline. Stanford Breed, MD ?[facility] ?Office Phone Number: [phone] ?11/24/2021 12:49 PM ? ? ?

## 2021-11-24 NOTE — Progress Notes (Signed)
Discharge instructions reviewed with Son. He verbalized understanding and all questions were answered.  ?

## 2021-11-24 NOTE — Progress Notes (Signed)
PHARMACIST LIPID MONITORING ? ? ?Jon Velazquez is a 71 y.o. male admitted on 11/23/2021 with TCAR.  Pharmacy has been consulted to optimize lipid-lowering therapy with the indication of secondary prevention for clinical ASCVD. ? ?Recent Labs: ? ?Lipid Panel (last 6 months):   ?Lab Results  ?Component Value Date  ? CHOL 194 11/24/2021  ? TRIG 168 (H) 11/24/2021  ? HDL 30 (L) 11/24/2021  ? CHOLHDL 6.5 11/24/2021  ? VLDL 34 11/24/2021  ? LDLCALC 130 (H) 11/24/2021  ? ? ?Hepatic function panel (last 6 months):   ?Lab Results  ?Component Value Date  ? AST 24 11/20/2021  ? ALT 20 11/20/2021  ? ALKPHOS 70 11/20/2021  ? BILITOT 0.9 11/20/2021  ? ? ?SCr (since admission):   ?Serum creatinine: 2 mg/dL (H) 35/68/61 6837 ?Estimated creatinine clearance: 37.2 mL/min (A) ? ?Current therapy and lipid therapy tolerance ?Current lipid-lowering therapy: simvastatin 20mg  qd ?Previous lipid-lowering therapies (if applicable):  ?Documented or reported allergies or intolerances to lipid-lowering therapies (if applicable): NKDA ? ?Assessment:   ?Patient agrees with changes to lipid-lowering therapy ? ?Plan:   ? ?1.Statin intensity (high intensity recommended for all patients regardless of the LDL):  Add or increase statin to high intensity. ? ?2.Add ezetimibe (if any one of the following):   Not indicated at this time. ? ?3.Refer to lipid clinic:   No ? ?4.Follow-up with:  Primary care provider - , MD ? ?5.Follow-up labs after discharge:  Changes in lipid therapy were made. Check a lipid panel in 8-12 weeks then annually.    ? ? ? ?10-12, PharmD ?11/24/2021, 9:15 AM ? ?

## 2021-11-24 NOTE — TOC Transition Note (Signed)
Transition of Care (TOC) - CM/SW Discharge Note ?Donn Pierini Charity fundraiser, BSN ?Transitions of Care ?Unit 4E- RN Case Manager ?See Treatment Team for direct phone #  ? ? ?Patient Details  ?Name: Jon Velazquez ?MRN: [id number] ?Date of Birth: 11-Sep-1951 ? ?Transition of Care (TOC) CM/SW Contact:  ?Zenda Alpers, Lenn Sink, RN ?Phone Number: ?11/24/2021, 10:55 AM ? ? ?Clinical Narrative:    ?Pt stable for transition home today, Transition of Care Department [facility]) has reviewed patient and no TOC needs have been identified at this time.  ? ? ?Final next level of care: Home/Self Care ?Barriers to Discharge: No Barriers Identified ? ? ?Patient Goals and CMS Choice ?  ? N/A ?  ? ?Discharge Placement ?  ?           ? Home ?  ?  ?  ? ?Discharge Plan and Services ?  ?  ?           ?  ?  ?  ?  ?  ?  ?  ?  ?  ?  ? ?Social Determinants of Health (SDOH) Interventions ?  ? ? ?Readmission Risk Interventions ?Readmission Risk Prevention Plan 11/24/2021  ?PCP or Specialist Appt within 5-7 Days Complete  ?Home Care Screening Complete  ?Medication Review (RN CM) Complete  ?Some recent data might be hidden  ? ? ? ? ? ?

## 2021-11-24 NOTE — Anesthesia Postprocedure Evaluation (Signed)
Anesthesia Post Note ? ?Patient: Jon Velazquez ? ?Procedure(s) Performed: Right Transcarotid Artery Revascularization (Right: Neck) ?ULTRASOUND GUIDANCE FOR VASCULAR ACCESS, LEFT FEMORAL VEIN (Left: Groin) ? ?  ? ?Patient location during evaluation: PACU ?Anesthesia Type: General ?Level of consciousness: awake and alert ?Pain management: pain level controlled ?Vital Signs Assessment: post-procedure vital signs reviewed and stable ?Respiratory status: spontaneous breathing, nonlabored ventilation, respiratory function stable and patient connected to nasal cannula oxygen ?Cardiovascular status: blood pressure returned to baseline and stable ?Postop Assessment: no apparent nausea or vomiting ?Anesthetic complications: no ? ? ?No notable events documented. ? ?Last Vitals:  ?Vitals:  ? 11/24/21 0724 11/24/21 1122  ?BP: (!) 157/66 (!) 163/70  ?Pulse: 78 75  ?Resp: 18 19  ?Temp: 36.7 ?C 36.8 ?C  ?SpO2: 100% 97%  ?  ?Last Pain:  ?Vitals:  ? 11/24/21 1122  ?TempSrc: Oral  ?PainSc:   ? ? ?  ?  ?  ?  ?  ?  ? ?Marcene Duos E ? ? ? ? ?

## 2021-11-27 NOTE — Discharge Summary (Signed)
Discharge Summary     Jon Velazquez [birth year] 71 y.o. male  [id number]  Admission Date: 11/23/2021  Discharge Date: 11/24/2021  Physician: No att. providers found  Admission Diagnosis: Carotid artery stenosis, symptomatic, right [I65.21]   HPI:   This is a 71 y.o. male who presents to care for evaluation of possible symptomatic right carotid artery stenosis.  The patient has had 3 TIAs over the past several months.  All of them have involved left-sided weakness, and aphasia.  He has thankfully recovered from these without any deficit.  The patient has been seen by my partner, Dr. Curt Jews, who thought a TCAR would be appropriate for him.  A CT angiogram was performed.  I reviewed this personally.  I disagree with the attending radiologist read.  I think he has a focal plaque which is significant in his carotid and causing turbulent flow.  This could easily explain transient ischemic attacks.  The stenosis is at least 50% or more based on NASCET criteria.    Hospital Course:  The patient was admitted to the hospital and taken to the operating room on 11/23/2021 and underwent Right transcarotid artery revascularization    Findings: unremarkable TCAR. Good technical result from stenting. Awoke in OR neurologically intact. No change in neuro exam in PACU.  The pt tolerated the procedure well and was transported to the PACU in good condition.   By POD 1, the pt neuro status was intact.  He was doing well and discharged home.    Discharge Instructions     Discharge patient   Complete by: As directed    Discharge home once pt has been seen by Dr. Stanford Breed.   Discharge disposition: 01-Home or Self Care   Discharge patient date: 11/24/2021       Discharge Diagnosis:  Carotid artery stenosis, symptomatic, right [I65.21]  Secondary Diagnosis: Patient Active Problem List   Diagnosis Date Noted   Carotid artery stenosis, symptomatic, right 11/23/2021   Type 2 diabetes mellitus  with hyperlipidemia ([place])    Primary hypertension    Hyperlipidemia    Stroke ([place]) 06/07/2021   Lumbar stenosis with neurogenic claudication 06/16/2015   Past Medical History:  Diagnosis Date   Cerebral infarction ([place]) 05/2021   COPD (chronic obstructive pulmonary disease) (H[facility])    Diabetes mellitus without complication (H[facility])    type 2   GERD (gastroesophageal reflux disease)    H/O fracture of ankle    Hyperlipidemia    Hypertension    Stroke [facility])     Allergies as of 11/24/2021   No Known Allergies      Medication List     STOP taking these medications    simvastatin 20 MG tablet Commonly known as: ZOCOR       TAKE these medications    apixaban 5 MG Tabs tablet Commonly known as: ELIQUIS Take 1 tablet (5 mg total) by mouth 2 (two) times daily.   aspirin EC 81 MG tablet Take 81 mg by mouth daily. Swallow whole.   atorvastatin 80 MG tablet Commonly known as: Lipitor Take 1 tablet (80 mg total) by mouth daily. Changing simvastatin to atorvastatin   benazepril 20 MG tablet Commonly known as: LOTENSIN Take 20 mg by mouth daily.   calcium carbonate 600 MG Tabs tablet Commonly known as: OS-CAL Take 600 mg by mouth daily.   clopidogrel 75 MG tablet Commonly known as: PLAVIX Take 75 mg by mouth daily.   fenofibrate 160 MG tablet Take 160 mg  by mouth daily.   Fish Oil 1000 MG Caps Take 1,000 mg by mouth in the morning and at bedtime.   HYDROcodone-acetaminophen 5-325 MG tablet Commonly known as: Norco Take 1 tablet by mouth every 6 (six) hours as needed for moderate pain.   Lantus SoloStar 100 UNIT/ML Solostar Pen Generic drug: insulin glargine Inject 6-12 Units into the skin See admin instructions. Inject 12 units into the skin in the morning.   metoprolol succinate 25 MG 24 hr tablet Commonly known as: TOPROL-XL Take 25 mg by mouth daily.   nicotine 21 mg/24hr patch Commonly known as: NICODERM CQ - dosed in mg/24 hours Place 1 patch (21 mg  total) onto the skin daily.   pantoprazole 20 MG tablet Commonly known as: PROTONIX Take 20 mg by mouth daily.   sodium bicarbonate 650 MG tablet Take 650 mg by mouth daily.   Vitamin D-3 25 MCG (1000 UT) Caps Take 1,000 Units by mouth daily.         [facility] Discharge Instructions Carotid Endarterectomy (CEA)  Please refer to the following instructions for your post-procedure care. Your surgeon or physician assistant will discuss any changes with you.  Activity  You are encouraged to walk as much as you can. You can slowly return to normal activities but must avoid strenuous activity and heavy lifting until your doctor tell you it's OK. Avoid activities such as vacuuming or swinging a golf club. You can drive after one week if you are comfortable and you are no longer taking prescription pain medications. It is normal to feel tired for serval weeks after your surgery. It is also normal to have difficulty with sleep habits, eating, and bowel movements after surgery. These will go away with time.  Bathing/Showering  You may shower after you come home. Do not soak in a bathtub, hot tub, or swim until the incision heals completely.  Incision Care  Shower every day. Clean your incision with mild soap and water. Pat the area dry with a clean towel. You do not need a bandage unless otherwise instructed. Do not apply any ointments or creams to your incision. You may have skin glue on your incision. Do not peel it off. It will come off on its own in about one week. Your incision may feel thickened and raised for several weeks after your surgery. This is normal and the skin will soften over time. For Men Only: It's OK to shave around the incision but do not shave the incision itself for 2 weeks. It is common to have numbness under your chin that could last for several months.  Diet  Resume your normal diet. There are no special food restrictions following  this procedure. A low fat/low cholesterol diet is recommended for all patients with vascular disease. In order to heal from your surgery, it is CRITICAL to get adequate nutrition. Your body requires vitamins, minerals, and protein. Vegetables are the best source of vitamins and minerals. Vegetables also provide the perfect balance of protein. Processed food has little nutritional value, so try to avoid this.  Medications  Resume taking all of your medications unless your doctor or physician assistant tells you not to.  If your incision is causing pain, you may take over-the- counter pain relievers such as acetaminophen (Tylenol). If you were prescribed a stronger pain medication, please be aware these medications can cause nausea and constipation.  Prevent nausea by taking the medication with a snack or meal. Avoid  constipation by drinking plenty of fluids and eating foods with a high amount of fiber, such as fruits, vegetables, and grains.  Do not take Tylenol if you are taking prescription pain medications.  Follow Up  Our office will schedule a follow up appointment 2-3 weeks following discharge.  Please call us immediately for any of the following conditions  Increased pain, redness, drainage (pus) from your incision site. Fever of 101 degrees or higher. If you should develop stroke (slurred speech, difficulty swallowing, weakness on one side of your body, loss of vision) you should call 911 and go to the nearest emergency room.  Reduce your risk of vascular disease:  Stop smoking. If you would like help call [facility] at 1-800-QUIT-NOW [phone]) or [place] at [phone]. Manage your cholesterol Maintain a desired weight Control your diabetes Keep your blood pressure down  If you have any questions, please call the office at [phone].  Prescriptions given: 1.   Norco#8 No Refill 2.  Lipitor 80mg  daily  Disposition: home  Patient's condition: is Good  Follow  up: 1. [facility] in 4 weeks duplex   Leontine Locket, PA-C [facility] [phone]   --- For [facility] use ---   Modified Rankin score at D/C (0-6): 0  IV medication needed for:  1. Hypertension: No 2. Hypotension: No  Post-op Complications: No  1. Post-op CVA or TIA: No  If yes: Event classification (right eye, left eye, right cortical, left cortical, verterobasilar, other): n/a  If yes: Timing of event (intra-op, <6 hrs post-op, >=6 hrs post-op, unknown): n/a  2. CN injury: No  If yes: CN n/a injuried   3. Myocardial infarction: No  If yes: Dx by (EKG or clinical, Troponin): n/a  4.  CHF: No  5.  Dysrhythmia (new): No  6. Wound infection: No  7. Reperfusion symptoms: No  8. Return to OR: No  If yes: return to OR for (bleeding, neurologic, other CEA incision, other): n/a  Discharge medications: Statin use:  Yes ASA use:  Yes   Beta blocker use:  Yes ACE-Inhibitor use:  Yes  ARB use:  No CCB use: No P2Y12 Antagonist use: Yes, [ x] Plavix, [ ]  Plasugrel, [ ]  Ticlopinine, [ ]  Ticagrelor, [ ]  Other, [ ]  No for medical reason, [ ]  Non-compliant, [ ]  Not-indicated Anti-coagulant use:  Yes, [ ]  Warfarin, [ ]  Rivaroxaban, [ ]  Dabigatran, Apixaban after a month of plavix.

## 2021-12-26 ENCOUNTER — Other Ambulatory Visit: Payer: Self-pay

## 2021-12-26 DIAGNOSIS — I6521 Occlusion and stenosis of right carotid artery: Secondary | ICD-10-CM

## 2022-01-09 ENCOUNTER — Ambulatory Visit (HOSPITAL_COMMUNITY)
Admission: RE | Admit: 2022-01-09 | Discharge: 2022-01-09 | Disposition: A | Discharge status: Home / Self Care | Payer: Medicare FFS | Source: Ambulatory Visit | Attending: Vascular Surgery | Admitting: Vascular Surgery

## 2022-01-09 ENCOUNTER — Ambulatory Visit (INDEPENDENT_AMBULATORY_CARE_PROVIDER_SITE_OTHER): Payer: Medicare FFS | Admitting: Vascular Surgery

## 2022-01-09 ENCOUNTER — Encounter: Payer: Self-pay | Admitting: Vascular Surgery

## 2022-01-09 VITALS — BP 168/76 | HR 60 | Temp 98.3°F | Resp 20 | Ht 72.0 in | Wt 200.0 lb

## 2022-01-09 DIAGNOSIS — I6521 Occlusion and stenosis of right carotid artery: Secondary | ICD-10-CM | POA: Insufficient documentation

## 2022-01-09 DIAGNOSIS — Z95828 Presence of other vascular implants and grafts: Secondary | ICD-10-CM

## 2022-01-09 NOTE — Progress Notes (Signed)
[facility] [place] ? ?ASSESSMENT / PLAN: ?Jon Velazquez is a 71 y.o. male s/p R TCAR 11/23/21. Continue ASA and statin. OK to stop Plavix. Ok to resume Eliquis. Follow up with me in 1 year with repeat carotid duplex.  ? ?CHIEF COMPLAINT: symptomatic carotid stenosis ? ?HISTORY OF PRESENT ILLNESS: ?Jon Velazquez is a 71 y.o. male who presents to care for evaluation of possible symptomatic right carotid artery stenosis.  The patient has had 3 TIAs over the past several months.  All of them have involved left-sided weakness, and aphasia.  He has thankfully recovered from these without any deficit.  The patient has been seen by my partner, Dr. Curt Jews, who thought a TCAR would be appropriate for him.  A CT angiogram was performed.  I reviewed this personally.  I disagree with the attending radiologist read.  I think he has a focal plaque which is significant in his carotid and causing turbulent flow.  This could easily explain transient ischemic attacks.  The stenosis is at least 50% or more based on NASCET criteria. ? ?01/09/22: patient returns status post right TCAR 11/23/2021.  He has done very well.  He is on no pain in his incision.  He is healed well.  He has no new focal neurologic symptoms. ? ?Past Medical History:  ?Diagnosis Date  ? Cerebral infarction ([place]) 05/2021  ? COPD (chronic obstructive pulmonary disease) ([place])   ? Diabetes mellitus without complication ([place])   ? type 2  ? GERD (gastroesophageal reflux disease)   ? H/O fracture of ankle   ? Hyperlipidemia   ? Hypertension   ? Stroke [facility])   ? ? ?Past Surgical History:  ?Procedure Laterality Date  ? BACK SURGERY  mid 57s  ? L5-S1, Dr. Arelia Sneddon, [place]  ? COLONOSCOPY  2012  ? MULTIPLE TOOTH EXTRACTIONS    ? POSTERIOR LUMBAR FUSION 4 LEVEL N/A 06/16/2015  ? Procedure: POSTERIOR LUMBAR INTERBODY FUSION WITH PEDICLE SCREWS LUMBAR ONE-TWO ,LUMBAR TWO-THREE,LUMBAR THREE-FOUR  ,LUMBAR FOUR-FIVE ;INSERTION BONE GROWTH STIMULATOR ;   Surgeon: Karie Chimera, MD;  Location: [place] [facility];  Service: Neurosurgery;  Laterality: N/A;  ? TRANSCAROTID ARTERY REVASCULARIZATION?  Right 11/23/2021  ? Procedure: Right Transcarotid Artery Revascularization;  Surgeon: Cherre Robins, MD;  Location: [facility] OR;  Service: Vascular;  Laterality: Right;  ? ULTRASOUND GUIDANCE FOR VASCULAR ACCESS Left 11/23/2021  ? Procedure: ULTRASOUND GUIDANCE FOR VASCULAR ACCESS, LEFT FEMORAL VEIN;  Surgeon: Cherre Robins, MD;  Location: [place];  Service: Vascular;  Laterality: Left;  ? ? ?Family History  ?Problem Relation Age of Onset  ? Lung cancer Father   ? Cancer Sister   ? ? ?Social History  ? ?Socioeconomic History  ? Marital status: Divorced  ?  Spouse name: Not on file  ? Number of children: 5  ? Years of education: Not on file  ? Highest education level: High school graduate  ?Occupational History  ? Not on file  ?Tobacco Use  ? Smoking status: Every Day  ?  Packs/day: 1.00  ?  Years: 50.00  ?  Pack years: 50.00  ?  Types: Cigarettes  ? Smokeless tobacco: Never  ?Vaping Use  ? Vaping Use: Never used  ?Substance and Sexual Activity  ? Alcohol use: No  ?  Alcohol/week: 0.0 standard drinks  ? Drug use: Not Currently  ?  Types: Marijuana  ?  Comment: stop smoking [place] 09/2021  ? Sexual activity: Not on file  ?  Other Topics Concern  ? Not on file  ?Social History Narrative  ? 09/11/21 son, Kyriakos lives with him  ? Caffeine 1-2 a day  ? ?Social Determinants of Health  ? ?Financial Resource Strain: Not on file  ?Food Insecurity: Not on file  ?Transportation Needs: Not on file  ?Physical Activity: Not on file  ?Stress: Not on file  ?Social Connections: Not on file  ?Intimate Partner Violence: Not on file  ? ? ?No Known Allergies ? ?Current Outpatient Medications  ?Medication Sig Dispense Refill  ? apixaban (ELIQUIS) 5 MG TABS tablet Take 1 tablet (5 mg total) by mouth 2 (two) times daily. (Patient not taking: Reported on 11/15/2021) 60 tablet 6  ? aspirin EC 81 MG tablet Take 81  mg by mouth daily. Swallow whole.    ? atorvastatin (LIPITOR) 80 MG tablet Take 1 tablet (80 mg total) by mouth daily. Changing simvastatin to atorvastatin 90 tablet 3  ? benazepril (LOTENSIN) 20 MG tablet Take 20 mg by mouth daily.    ? calcium carbonate (OS-CAL) 600 MG TABS tablet Take 600 mg by mouth daily.     ? Cholecalciferol (VITAMIN D-3) 1000 UNITS CAPS Take 1,000 Units by mouth daily.     ? clopidogrel (PLAVIX) 75 MG tablet Take 75 mg by mouth daily.    ? fenofibrate 160 MG tablet Take 160 mg by mouth daily.    ? HYDROcodone-acetaminophen (NORCO) 5-325 MG tablet Take 1 tablet by mouth every 6 (six) hours as needed for moderate pain. 8 tablet 0  ? LANTUS SOLOSTAR 100 UNIT/ML Solostar Pen Inject 6-12 Units into the skin See admin instructions. Inject 12 units into the skin in the morning.    ? metoprolol succinate (TOPROL-XL) 25 MG 24 hr tablet Take 25 mg by mouth daily.    ? nicotine (NICODERM CQ - DOSED IN MG/24 HOURS) 21 mg/24hr patch Place 1 patch (21 mg total) onto the skin daily. (Patient not taking: Reported on 11/14/2021) 28 patch 0  ? Omega-3 Fatty Acids (FISH OIL) 1000 MG CAPS Take 1,000 mg by mouth in the morning and at bedtime.    ? pantoprazole (PROTONIX) 20 MG tablet Take 20 mg by mouth daily.    ? sodium bicarbonate 650 MG tablet Take 650 mg by mouth daily.    ? ?No current facility-administered medications for this visit.  ? ? ?PHYSICAL EXAM ?There were no vitals filed for this visit. ? ? ?Constitutional: chronically ill appearing. no distress. Appears well nourished.  ?Neurologic: CN intact. no focal findings. no sensory loss. ?Psychiatric:  Mood and affect symmetric and appropriate. ?Eyes:  No icterus. No conjunctival pallor. ?Ears, nose, throat:  mucous membranes moist. Midline trachea.  ?Cardiac: regular rate and rhythm.  ?Respiratory:  unlabored. ?Abdominal:  soft, non-tender, non-distended.  ?Peripheral vascular: 2+ radial pulses ?Extremity: no edema. no cyanosis. no pallor.  ?Skin: no  gangrene. no ulceration.  ?Lymphatic: no Stemmer's sign. no palpable lymphadenopathy. ? ?PERTINENT LABORATORY AND RADIOLOGIC DATA ? ?Most recent CBC ? ?  Latest Ref Rng & Units 11/24/2021  ?  1:59 AM 11/20/2021  ?  3:03 PM 06/08/2021  ?  5:25 AM  ?CBC  ?WBC 4.0 - 10.5 K/uL 10.9   9.4   7.0    ?Hemoglobin 13.0 - 17.0 g/dL 69.6   29.5   28.4    ?Hematocrit 39.0 - 52.0 % 30.8   39.7   36.1    ?Platelets 150 - 400 K/uL 133   186  183    ?  ? ?Most recent CMP ? ?  Latest Ref Rng & Units 11/24/2021  ?  1:59 AM 11/20/2021  ?  3:03 PM 10/16/2021  ?  2:38 PM  ?CMP  ?Glucose 70 - 99 mg/dL 181   176     ?BUN 8 - 23 mg/dL 26   29   35    ?Creatinine 0.61 - 1.24 mg/dL 2.00   2.03   1.94    ?Sodium 135 - 145 mmol/L 131   132     ?Potassium 3.5 - 5.1 mmol/L 4.2   4.1     ?Chloride 98 - 111 mmol/L 102   98     ?CO2 22 - 32 mmol/L 20   21     ?Calcium 8.9 - 10.3 mg/dL 8.6   9.9     ?Total Protein 6.5 - 8.1 g/dL  7.6     ?Total Bilirubin 0.3 - 1.2 mg/dL  0.9     ?Alkaline Phos 38 - 126 U/L  70     ?AST 15 - 41 U/L  24     ?ALT 0 - 44 U/L  20     ? ? ?Renal function ?CrCl cannot be calculated (Patient's most recent lab result is older than the maximum 21 days allowed.). ? ?Hgb A1c MFr Bld (%)  ?Date Value  ?11/20/2021 8.6 (H)  ? ? ?LDL Cholesterol  ?Date Value Ref Range Status  ?11/24/2021 130 (H) 0 - 99 mg/dL Final  ?  Comment:  ?         ?Total Cholesterol/HDL:CHD Risk ?Coronary Heart Disease Risk Table ?                    Men   Women ? 1/2 Average Risk   3.4   3.3 ? Average Risk       5.0   4.4 ? 2 X Average Risk   9.6   7.1 ? 3 X Average Risk  23.4   11.0 ?       ?Use the calculated Patient Ratio ?above and the CHD Risk Table ?to determine the patient's CHD Risk. ?       ?ATP III CLASSIFICATION (LDL): ? <100     mg/dL   Optimal ? 100-129  mg/dL   Near or Above ?                   Optimal ? 130-159  mg/dL   Borderline ? 160-189  mg/dL   High ? >190     mg/dL   Very High ?Performed at [facility], [place] [address], [place],  [place] [address] ?  ?  ? ?Carotid duplex shows good technical result from TCAR.  No residual stenosis. ? ?Yevonne Aline. Stanford Breed, MD ?[facility] [place] ?Office Phone Number: [phone] ?

## 2022-04-25 ENCOUNTER — Encounter: Payer: Self-pay | Admitting: *Deleted

## 2022-06-24 DEATH — deceased
